# Patient Record
Sex: Male | Born: 1980
Health system: Southern US, Community
[De-identification: ages and names within clinical notes are randomized; demographics above are authoritative.]

## PROBLEM LIST (undated history)

## (undated) DIAGNOSIS — R278 Other lack of coordination: Secondary | ICD-10-CM

## (undated) DIAGNOSIS — R4789 Other speech disturbances: Secondary | ICD-10-CM

## (undated) DIAGNOSIS — G35 Multiple sclerosis: Secondary | ICD-10-CM

## (undated) DIAGNOSIS — I639 Cerebral infarction, unspecified: Secondary | ICD-10-CM

## (undated) DIAGNOSIS — M549 Dorsalgia, unspecified: Secondary | ICD-10-CM

## (undated) DIAGNOSIS — H538 Other visual disturbances: Secondary | ICD-10-CM

## (undated) DIAGNOSIS — R2689 Other abnormalities of gait and mobility: Secondary | ICD-10-CM

## (undated) DIAGNOSIS — R296 Repeated falls: Secondary | ICD-10-CM

## (undated) HISTORY — PX: HERNIA REPAIR: SHX51

## (undated) HISTORY — PX: FACIAL COSMETIC SURGERY: SHX629

---

## 2000-03-17 ENCOUNTER — Emergency Department (HOSPITAL_COMMUNITY): Admission: EM | Admit: 2000-03-17 | Discharge: 2000-03-17 | Payer: Self-pay | Admitting: Emergency Medicine

## 2001-04-14 ENCOUNTER — Emergency Department (HOSPITAL_COMMUNITY): Admission: EM | Admit: 2001-04-14 | Discharge: 2001-04-15 | Payer: Self-pay | Admitting: Emergency Medicine

## 2003-05-16 ENCOUNTER — Emergency Department (HOSPITAL_COMMUNITY): Admission: EM | Admit: 2003-05-16 | Discharge: 2003-05-16 | Payer: Self-pay | Admitting: Emergency Medicine

## 2006-03-26 ENCOUNTER — Emergency Department: Payer: Self-pay | Admitting: Emergency Medicine

## 2006-04-25 ENCOUNTER — Emergency Department: Payer: Self-pay | Admitting: Emergency Medicine

## 2006-07-17 ENCOUNTER — Emergency Department (HOSPITAL_COMMUNITY): Admission: EM | Admit: 2006-07-17 | Discharge: 2006-07-17 | Payer: Self-pay | Admitting: Emergency Medicine

## 2006-07-21 ENCOUNTER — Emergency Department (HOSPITAL_COMMUNITY): Admission: EM | Admit: 2006-07-21 | Discharge: 2006-07-21 | Payer: Self-pay | Admitting: Emergency Medicine

## 2006-09-07 ENCOUNTER — Emergency Department (HOSPITAL_COMMUNITY): Admission: EM | Admit: 2006-09-07 | Discharge: 2006-09-07 | Payer: Self-pay | Admitting: Emergency Medicine

## 2008-11-26 ENCOUNTER — Inpatient Hospital Stay (HOSPITAL_COMMUNITY): Admission: EM | Admit: 2008-11-26 | Discharge: 2008-12-02 | Payer: Self-pay | Admitting: *Deleted

## 2008-11-26 ENCOUNTER — Ambulatory Visit: Payer: Self-pay | Admitting: *Deleted

## 2008-11-26 ENCOUNTER — Emergency Department (HOSPITAL_COMMUNITY): Admission: EM | Admit: 2008-11-26 | Discharge: 2008-11-26 | Payer: Self-pay | Admitting: Emergency Medicine

## 2009-05-24 ENCOUNTER — Emergency Department (HOSPITAL_COMMUNITY): Admission: EM | Admit: 2009-05-24 | Discharge: 2009-05-24 | Payer: Self-pay | Admitting: Emergency Medicine

## 2009-10-17 ENCOUNTER — Emergency Department (HOSPITAL_COMMUNITY): Admission: EM | Admit: 2009-10-17 | Discharge: 2009-10-17 | Payer: Self-pay | Admitting: Emergency Medicine

## 2009-11-21 ENCOUNTER — Emergency Department (HOSPITAL_COMMUNITY): Admission: EM | Admit: 2009-11-21 | Discharge: 2009-11-21 | Payer: Self-pay | Admitting: Emergency Medicine

## 2010-01-22 ENCOUNTER — Emergency Department (HOSPITAL_COMMUNITY): Admission: EM | Admit: 2010-01-22 | Discharge: 2010-01-23 | Payer: Self-pay | Admitting: Emergency Medicine

## 2010-07-23 ENCOUNTER — Emergency Department (HOSPITAL_COMMUNITY): Admission: EM | Admit: 2010-07-23 | Discharge: 2010-07-23 | Payer: Self-pay | Admitting: Family Medicine

## 2011-02-13 ENCOUNTER — Emergency Department (HOSPITAL_COMMUNITY)
Admission: EM | Admit: 2011-02-13 | Discharge: 2011-02-14 | Disposition: A | Discharge status: Home / Self Care | Payer: Self-pay | Attending: Emergency Medicine | Admitting: Emergency Medicine

## 2011-02-13 DIAGNOSIS — R599 Enlarged lymph nodes, unspecified: Secondary | ICD-10-CM | POA: Insufficient documentation

## 2011-02-13 DIAGNOSIS — R042 Hemoptysis: Secondary | ICD-10-CM | POA: Insufficient documentation

## 2011-02-13 DIAGNOSIS — J029 Acute pharyngitis, unspecified: Secondary | ICD-10-CM | POA: Insufficient documentation

## 2011-02-14 ENCOUNTER — Emergency Department (HOSPITAL_COMMUNITY): Payer: Self-pay

## 2011-03-01 LAB — DIFFERENTIAL
Lymphocytes Relative: 2 % — ABNORMAL LOW (ref 12–46)
Lymphs Abs: 0.3 10*3/uL — ABNORMAL LOW (ref 0.7–4.0)
Monocytes Absolute: 0.8 10*3/uL (ref 0.1–1.0)
Monocytes Relative: 6 % (ref 3–12)
Neutro Abs: 12.9 10*3/uL — ABNORMAL HIGH (ref 1.7–7.7)
Neutrophils Relative %: 92 % — ABNORMAL HIGH (ref 43–77)

## 2011-03-01 LAB — POCT I-STAT, CHEM 8
BUN: 10 mg/dL (ref 6–23)
Calcium, Ion: 1.05 mmol/L — ABNORMAL LOW (ref 1.12–1.32)
Chloride: 109 mEq/L (ref 96–112)
Glucose, Bld: 97 mg/dL (ref 70–99)
Potassium: 4 mEq/L (ref 3.5–5.1)
Sodium: 138 mEq/L (ref 135–145)

## 2011-03-01 LAB — HEPATIC FUNCTION PANEL
Alkaline Phosphatase: 75 U/L (ref 39–117)
Indirect Bilirubin: 1.1 mg/dL — ABNORMAL HIGH (ref 0.3–0.9)
Total Bilirubin: 1.4 mg/dL — ABNORMAL HIGH (ref 0.3–1.2)

## 2011-03-01 LAB — LIPASE, BLOOD: Lipase: 16 U/L (ref 11–59)

## 2011-03-01 LAB — POCT CARDIAC MARKERS
Myoglobin, poc: 94.3 ng/mL (ref 12–200)
Troponin i, poc: <0.05 ng/mL (ref 0.00–0.09)

## 2011-03-01 LAB — RAPID URINE DRUG SCREEN, HOSP PERFORMED: Opiates: NOT DETECTED
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
## 2011-09-03 LAB — POCT I-STAT, CHEM 8
Chloride: 104 mEq/L (ref 96–112)
Creatinine, Ser: 1.5 mg/dL (ref 0.4–1.5)
Glucose, Bld: 80 mg/dL (ref 70–99)
Hemoglobin: 17.7 g/dL — ABNORMAL HIGH (ref 13.0–17.0)

## 2011-09-03 LAB — URINALYSIS, ROUTINE W REFLEX MICROSCOPIC
Bilirubin Urine: NEGATIVE
Glucose, UA: NEGATIVE mg/dL
Hgb urine dipstick: NEGATIVE
Ketones, ur: NEGATIVE mg/dL
Protein, ur: NEGATIVE mg/dL
Specific Gravity, Urine: 1.005 (ref 1.005–1.030)

## 2011-09-03 LAB — DIFFERENTIAL
Eosinophils Relative: 1 % (ref 0–5)
Lymphocytes Relative: 29 % (ref 12–46)
Lymphs Abs: 3 10*3/uL (ref 0.7–4.0)
Neutro Abs: 6.6 10*3/uL (ref 1.7–7.7)
Neutrophils Relative %: 64 % (ref 43–77)

## 2011-09-03 LAB — RAPID URINE DRUG SCREEN, HOSP PERFORMED
Amphetamines: NOT DETECTED
Benzodiazepines: NOT DETECTED

## 2011-09-03 LAB — CBC
HCT: 48.4 % (ref 39.0–52.0)
Platelets: 224 10*3/uL (ref 150–400)
RBC: 5.05 MIL/uL (ref 4.22–5.81)
WBC: 10.3 10*3/uL (ref 4.0–10.5)

## 2011-09-03 LAB — ETHANOL: Alcohol, Ethyl (B): 64 mg/dL — ABNORMAL HIGH (ref 0–10)

## 2011-09-22 ENCOUNTER — Emergency Department (HOSPITAL_COMMUNITY)
Admission: EM | Admit: 2011-09-22 | Discharge: 2011-09-22 | Disposition: A | Discharge status: Home / Self Care | Payer: Self-pay | Attending: Emergency Medicine | Admitting: Emergency Medicine

## 2011-09-22 ENCOUNTER — Emergency Department (HOSPITAL_COMMUNITY): Payer: Self-pay

## 2011-09-22 DIAGNOSIS — R6884 Jaw pain: Secondary | ICD-10-CM | POA: Insufficient documentation

## 2011-09-22 DIAGNOSIS — S0003XA Contusion of scalp, initial encounter: Secondary | ICD-10-CM | POA: Insufficient documentation

## 2011-09-22 DIAGNOSIS — IMO0002 Reserved for concepts with insufficient information to code with codable children: Secondary | ICD-10-CM | POA: Insufficient documentation

## 2011-10-22 ENCOUNTER — Emergency Department (HOSPITAL_COMMUNITY)
Admission: EM | Admit: 2011-10-22 | Discharge: 2011-10-23 | Discharge status: Against Medical Advice | Payer: Self-pay | Attending: Emergency Medicine | Admitting: Emergency Medicine

## 2011-10-22 ENCOUNTER — Encounter: Payer: Self-pay | Admitting: *Deleted

## 2011-10-22 DIAGNOSIS — Z5320 Procedure and treatment not carried out because of patient's decision for unspecified reasons: Secondary | ICD-10-CM | POA: Insufficient documentation

## 2011-10-22 DIAGNOSIS — R21 Rash and other nonspecific skin eruption: Secondary | ICD-10-CM | POA: Insufficient documentation

## 2011-10-22 DIAGNOSIS — R51 Headache: Secondary | ICD-10-CM | POA: Insufficient documentation

## 2011-10-22 NOTE — ED Notes (Signed)
Patient states he noted onset of headache, rash, pressure behind his eyes.  He also reports he feels flushed.  Patient was seen by clinic recently and his tests were "ok"

## 2011-10-25 ENCOUNTER — Emergency Department (HOSPITAL_COMMUNITY)
Admission: EM | Admit: 2011-10-25 | Discharge: 2011-10-25 | Discharge status: Against Medical Advice | Payer: Self-pay | Attending: Emergency Medicine | Admitting: Emergency Medicine

## 2011-10-25 ENCOUNTER — Encounter (HOSPITAL_COMMUNITY): Payer: Self-pay | Admitting: Emergency Medicine

## 2011-10-25 DIAGNOSIS — R21 Rash and other nonspecific skin eruption: Secondary | ICD-10-CM

## 2011-10-25 NOTE — ED Provider Notes (Signed)
Patient left prior to being evaluated by physician or midlevel  Martin Bailiff, MD 10/25/11 [phone]

## 2011-10-25 NOTE — ED Notes (Signed)
Facial rash x 1 week

## 2011-10-28 ENCOUNTER — Emergency Department (HOSPITAL_COMMUNITY)
Admission: EM | Admit: 2011-10-28 | Discharge: 2011-10-28 | Disposition: A | Discharge status: Home / Self Care | Payer: Self-pay | Attending: Emergency Medicine | Admitting: Emergency Medicine

## 2011-10-28 ENCOUNTER — Encounter (HOSPITAL_COMMUNITY): Payer: Self-pay | Admitting: Adult Health

## 2011-10-28 DIAGNOSIS — L0201 Cutaneous abscess of face: Secondary | ICD-10-CM | POA: Insufficient documentation

## 2011-10-28 DIAGNOSIS — L03211 Cellulitis of face: Secondary | ICD-10-CM | POA: Insufficient documentation

## 2011-10-28 DIAGNOSIS — R21 Rash and other nonspecific skin eruption: Secondary | ICD-10-CM | POA: Insufficient documentation

## 2011-10-28 MED ORDER — DOXYCYCLINE HYCLATE 100 MG PO CAPS: 100.0000 mg | ORAL_CAPSULE | Freq: Two times a day (BID) | ORAL | Status: AC

## 2011-10-28 MED ORDER — HYDROCODONE-ACETAMINOPHEN 5-325 MG PO TABS: 1.0000 | ORAL_TABLET | ORAL | Status: AC | PRN

## 2011-10-28 MED ORDER — LIDOCAINE-EPINEPHRINE 1 %-1:100000 IJ SOLN
10.0000 mL | Freq: Once | INTRAMUSCULAR | Status: DC
  Filled 2011-10-28: qty 10

## 2011-10-28 MED ORDER — LIDOCAINE HCL 2 % IJ SOLN
10.0000 mL | Freq: Once | INTRAMUSCULAR | Status: AC
  Administered 2011-10-28: 200 mg
  Filled 2011-10-28: qty 1

## 2011-10-28 NOTE — ED Provider Notes (Signed)
History     CSN: [id number] Arrival date & time: 10/28/2011  3:13 PM   First MD Initiated Contact with Patient 10/28/11 1553      Chief Complaint  Patient presents with  . Abscess    (Consider location/radiation/quality/duration/timing/severity/associated sxs/prior treatment) Patient is a 30 y.o. male presenting with abscess. The history is provided by the patient.  Abscess  This is a new problem. The current episode started more than one week ago. The onset was gradual. The problem occurs rarely. The problem has been gradually worsening. Affected Location: left chin. The problem is moderate. The abscess is characterized by redness, draining, painfulness and swelling. It is unknown what he was exposed to. Pertinent negatives include no fever. His past medical history does not include skin abscesses in family. There were no sick contacts.  Pt reports he recently had a rash to the bottom half of his face and was seen for this in the ED x 2, most recently 11/26. Rash has started to improve but as it improved, he developed an area of swelling with purulent drainage to left side of chin. No hx of prior abscess. No aggravating or alleviating factors. Has not tried anything to relieve the symptoms.  Past Medical History  Diagnosis Date  . Asthma     Past Surgical History  Procedure Date  . Hernia repair     History reviewed. No pertinent family history.  History  Substance Use Topics  . Smoking status: Current Everyday Smoker  . Smokeless tobacco: Not on file  . Alcohol Use: No      Review of Systems  Constitutional: Negative for fever and chills.  HENT: Positive for facial swelling. Negative for ear pain, neck pain, dental problem and tinnitus.   Eyes: Negative for pain.  Skin: Positive for rash and wound.  Neurological: Negative for headaches.  Psychiatric/Behavioral: Negative for confusion.  All other systems reviewed and are negative.    Allergies  Review of  patient's allergies indicates no known allergies.  Home Medications  No current outpatient prescriptions on file.  BP 134/77  Pulse 97  Temp(Src) 98.8 F (37.1 C) (Oral)  Resp 16  SpO2 100%  Physical Exam  Nursing note and vitals reviewed. Constitutional: He is oriented to person, place, and time. He appears well-developed and well-nourished. No distress.  HENT:  Head: Normocephalic and atraumatic.  Right Ear: External ear normal.  Left Ear: External ear normal.  Mouth/Throat: Oropharynx is clear and moist.       See skin exam  Eyes: Conjunctivae and EOM are normal. Pupils are equal, round, and reactive to light.  Neck: Normal range of motion. Neck supple.  Cardiovascular: Normal rate, regular rhythm and normal heart sounds.   Pulmonary/Chest: Effort normal and breath sounds normal. No respiratory distress.  Abdominal: Soft. He exhibits no distension. There is no tenderness.  Musculoskeletal: Normal range of motion. He exhibits no edema and no tenderness.  Lymphadenopathy:    He has no cervical adenopathy.  Neurological: He is alert and oriented to person, place, and time. No cranial nerve deficit. Coordination normal.  Skin: Skin is warm and dry.       Resolving papular rash to chin and upper lip with dry areas of skin. Abscess to left side of chin, 3cm area of induration with 3mm area of fluctuance to center- appears erythematous and edematous. Small location of drainage seen. Significant TTP  Psychiatric: He has a normal mood and affect. His behavior is normal.  ED Course  Procedures (including critical care time) INCISION AND DRAINAGE Performed by: Lorenz Coaster Consent: Verbal consent obtained. Risks and benefits: risks, benefits and alternatives were discussed Type: abscess  Body area: Left chin  Anesthesia: local infiltration  Local anesthetic: lidocaine 1% without epinephrine  Anesthetic total: 1 ml  Complexity: complex Blunt dissection to break up  loculations  Drainage: purulent  Drainage amount: Small  Packing material: no packing used given small size of abscess pocket  Patient tolerance: Patient tolerated the procedure well with no immediate complications.    Labs Reviewed - No data to display No results found.   No diagnosis found.    MDM  Afebrile, non-toxic appearing patient. Abscess drained, abx given.  Advised 48h recheck.        [address] [place], [place] 10/30/11 [phone]

## 2011-10-28 NOTE — ED Notes (Signed)
Reports 10 days of left sided chin swelling, drainage and pain. Purulent drainage noted.

## 2011-11-15 NOTE — ED Provider Notes (Signed)
Medical screening examination/treatment/procedure(s) were performed by non-physician practitioner and as supervising physician I was immediately available for consultation/collaboration.   Rolan Bucco, MD 11/15/11 [phone]

## 2013-07-21 ENCOUNTER — Emergency Department (HOSPITAL_COMMUNITY)
Admission: EM | Admit: 2013-07-21 | Discharge: 2013-07-21 | Disposition: A | Discharge status: Home / Self Care | Payer: Self-pay | Attending: Emergency Medicine | Admitting: Emergency Medicine

## 2013-07-21 ENCOUNTER — Encounter (HOSPITAL_COMMUNITY): Payer: Self-pay | Admitting: Emergency Medicine

## 2013-07-21 DIAGNOSIS — F17200 Nicotine dependence, unspecified, uncomplicated: Secondary | ICD-10-CM | POA: Insufficient documentation

## 2013-07-21 DIAGNOSIS — Y9289 Other specified places as the place of occurrence of the external cause: Secondary | ICD-10-CM | POA: Insufficient documentation

## 2013-07-21 DIAGNOSIS — M25561 Pain in right knee: Secondary | ICD-10-CM

## 2013-07-21 DIAGNOSIS — X500XXA Overexertion from strenuous movement or load, initial encounter: Secondary | ICD-10-CM | POA: Insufficient documentation

## 2013-07-21 DIAGNOSIS — Y939 Activity, unspecified: Secondary | ICD-10-CM | POA: Insufficient documentation

## 2013-07-21 DIAGNOSIS — Y990 Civilian activity done for income or pay: Secondary | ICD-10-CM | POA: Insufficient documentation

## 2013-07-21 DIAGNOSIS — J45909 Unspecified asthma, uncomplicated: Secondary | ICD-10-CM | POA: Insufficient documentation

## 2013-07-21 DIAGNOSIS — S8990XA Unspecified injury of unspecified lower leg, initial encounter: Secondary | ICD-10-CM | POA: Insufficient documentation

## 2013-07-21 MED ORDER — TRAMADOL HCL 50 MG PO TABS: 50.0000 mg | ORAL_TABLET | Freq: Four times a day (QID) | ORAL | Status: DC | PRN

## 2013-07-21 MED ORDER — NAPROXEN 500 MG PO TABS: 500.0000 mg | ORAL_TABLET | Freq: Two times a day (BID) | ORAL | Status: DC

## 2013-07-21 NOTE — ED Provider Notes (Signed)
Medical screening examination/treatment/procedure(s) were performed by non-physician practitioner and as supervising physician I was immediately available for consultation/collaboration.   Junius Argyle, MD 07/21/13 [phone]

## 2013-07-21 NOTE — ED Provider Notes (Signed)
CSN: [id number]     Arrival date & time 07/21/13  1526 History  This chart was scribed for non-physician practitioner, Magnus Sinning, PA-C working with Joya Gaskins, MD by Max Fickle en, ED scribe. This patient was seen in room WTR8/WTR8 and the patient's care was started at 3:36 PM.  HPI Comments: Chief Complaint  Patient presents with  . Knee Pain    12 hr hx of r/knee pain   The history is provided by the patient. No language interpreter was used.   HPI Comments: Martin Barrett is a 32 y.o. male who presents to the Emergency Department complaining of sudden onset, constant throbbing knee pain with associated swelling that started this morning around 3 AM after twisting his knee.  He states he tripped over a curb at work. He heard a pop and felt a "pop" when he fell. Pt states bending and ambulation worsens the pain. Pt used ice after the fall occurred and it decreased the swelling. He has not taken anything for the pain.  He denies injuring this knee in the past.   Past Medical History  Diagnosis Date  . Asthma    Past Surgical History  Procedure Laterality Date  . Hernia repair     No family history on file. History  Substance Use Topics  . Smoking status: Current Every Day Smoker  . Smokeless tobacco: Not on file  . Alcohol Use: No    Review of Systems  Musculoskeletal: Positive for joint swelling and arthralgias.  All other systems reviewed and are negative.    Allergies  Review of patient's allergies indicates no known allergies.  Home Medications  No current outpatient prescriptions on file.  BP 129/75  Pulse 84  Temp(Src) 98.8 F (37.1 C) (Oral)  SpO2 100%  Physical Exam  Nursing note and vitals reviewed. Constitutional: He is oriented to person, place, and time. He appears well-developed and well-nourished. No distress.  HENT:  Head: Normocephalic and atraumatic.  Eyes: EOM are normal.  Neck: Neck supple. No tracheal deviation present.   Cardiovascular: Normal rate, regular rhythm and normal heart sounds.    2+ dorsal pedis pulse bilaterally  Pulmonary/Chest: Effort normal and breath sounds normal. No respiratory distress.  Musculoskeletal: Normal range of motion. He exhibits no edema.  Tender to palpation along medial joint line. No obvious edema or erythema. Full ROM of right knee. Negative anterior and posterior drawer sign of right knee.  Pain with valgus and varus stress.  Neurological: He is alert and oriented to person, place, and time.  Distal sensation of right foot intact.   Skin: Skin is warm and dry.  Psychiatric: He has a normal mood and affect. His behavior is normal.    ED Course  DIAGNOSTIC STUDIES: Oxygen Saturation is 100% on RA, normal by my interpretation.    COORDINATION OF CARE: 3:55 PM-Discussed treatment plan which includes a knee brace and crutches with pt at bedside and pt agreed to plan. Advised pt to follow up with PCP and orthopaedist.   Procedures (including critical care time)  Labs Reviewed - No data to display No results found. No diagnosis found.  MDM  Patient presenting with knee pain after twisting his knee when stepping off of a curb at work.  No obvious deformity.  No obvious laxity of the knee.  Patient given knee immobilizer and crutches.  Patient also given referral to Orthopedics.  I personally performed the services described in this documentation, which was scribed in  my presence. The recorded information has been reviewed and is accurate.    Pascal Lux Lowell, PA-C 07/21/13 [phone]

## 2014-04-28 ENCOUNTER — Emergency Department (HOSPITAL_COMMUNITY): Payer: Self-pay

## 2014-04-28 ENCOUNTER — Encounter (HOSPITAL_COMMUNITY): Payer: Self-pay | Admitting: Emergency Medicine

## 2014-04-28 ENCOUNTER — Emergency Department (HOSPITAL_COMMUNITY)
Admission: EM | Admit: 2014-04-28 | Discharge: 2014-04-28 | Disposition: A | Discharge status: Home / Self Care | Payer: Self-pay | Attending: Emergency Medicine | Admitting: Emergency Medicine

## 2014-04-28 DIAGNOSIS — Y929 Unspecified place or not applicable: Secondary | ICD-10-CM | POA: Insufficient documentation

## 2014-04-28 DIAGNOSIS — Z79899 Other long term (current) drug therapy: Secondary | ICD-10-CM | POA: Insufficient documentation

## 2014-04-28 DIAGNOSIS — F410 Panic disorder [episodic paroxysmal anxiety] without agoraphobia: Secondary | ICD-10-CM | POA: Insufficient documentation

## 2014-04-28 DIAGNOSIS — S20219A Contusion of unspecified front wall of thorax, initial encounter: Secondary | ICD-10-CM | POA: Insufficient documentation

## 2014-04-28 DIAGNOSIS — IMO0002 Reserved for concepts with insufficient information to code with codable children: Secondary | ICD-10-CM | POA: Insufficient documentation

## 2014-04-28 DIAGNOSIS — F17200 Nicotine dependence, unspecified, uncomplicated: Secondary | ICD-10-CM | POA: Insufficient documentation

## 2014-04-28 DIAGNOSIS — J45901 Unspecified asthma with (acute) exacerbation: Secondary | ICD-10-CM | POA: Insufficient documentation

## 2014-04-28 DIAGNOSIS — Y9389 Activity, other specified: Secondary | ICD-10-CM | POA: Insufficient documentation

## 2014-04-28 DIAGNOSIS — F411 Generalized anxiety disorder: Secondary | ICD-10-CM | POA: Insufficient documentation

## 2014-04-28 MED ORDER — LORAZEPAM 2 MG/ML IJ SOLN
0.5000 mg | Freq: Once | INTRAMUSCULAR | Status: AC
  Administered 2014-04-28: 0.5 mg via INTRAVENOUS

## 2014-04-28 MED ORDER — LORAZEPAM 2 MG/ML IJ SOLN
1.0000 mg | Freq: Once | INTRAMUSCULAR | Status: DC
  Filled 2014-04-28: qty 1

## 2014-04-28 MED ORDER — LORAZEPAM 1 MG PO TABS: 1.0000 mg | ORAL_TABLET | Freq: Once | ORAL | Status: DC

## 2014-04-28 MED ORDER — IBUPROFEN 800 MG PO TABS
800.0000 mg | ORAL_TABLET | Freq: Once | ORAL | Status: AC
  Administered 2014-04-28: 800 mg via ORAL
  Filled 2014-04-28: qty 1
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
## 2014-04-28 NOTE — Discharge Instructions (Signed)
Chest Contusion A chest contusion is a deep bruise on your chest area. Contusions are the result of an injury that caused bleeding under the skin. A chest contusion may involve bruising of the skin, muscles, or ribs. The contusion may turn blue, purple, or yellow. Minor injuries will give you a painless contusion, but more severe contusions may stay painful and swollen for a few weeks. CAUSES  A contusion is usually caused by a blow, trauma, or direct force to an area of the body. SYMPTOMS   Swelling and redness of the injured area.  Discoloration of the injured area.  Tenderness and soreness of the injured area.  Pain. DIAGNOSIS  The diagnosis can be made by taking a history and performing a physical exam. An X-ray, CT scan, or MRI may be needed to determine if there were any associated injuries, such as broken bones (fractures) or internal injuries. TREATMENT  Often, the best treatment for a chest contusion is resting, icing, and applying cold compresses to the injured area. Deep breathing exercises may be recommended to reduce the risk of pneumonia. Over-the-counter medicines may also be recommended for pain control. HOME CARE INSTRUCTIONS   Put ice on the injured area.  Put ice in a plastic bag.  Place a towel between your skin and the bag.  Leave the ice on for 15-20 minutes, 03-04 times a day.  Only take over-the-counter or prescription medicines as directed by your caregiver. Your caregiver may recommend avoiding anti-inflammatory medicines (aspirin, ibuprofen, and naproxen) for 48 hours because these medicines may increase bruising.  Rest the injured area.  Perform deep-breathing exercises as directed by your caregiver.  Stop smoking if you smoke.  Do not lift objects over 5 pounds (2.3 kg) for 3 days or longer if recommended by your caregiver. SEEK IMMEDIATE MEDICAL CARE IF:   You have increased bruising or swelling.  You have pain that is getting worse.  You have  difficulty breathing.  You have dizziness, weakness, or fainting.  You have blood in your urine or stool.  You cough up or vomit blood.  Your swelling or pain is not relieved with medicines. MAKE SURE YOU:   Understand these instructions.  Will watch your condition.  Will get help right away if you are not doing well or get worse. Document Released: 08/10/2001 Document Revised: 08/09/2012 Document Reviewed: 05/08/2012 [facility] Patient Information 2014 [place], [place].  Blunt Trauma You have been evaluated for injuries. You have been examined and your caregiver has not found injuries serious enough to require hospitalization. It is common to have multiple bruises and sore muscles following an accident. These tend to feel worse for the first 24 hours. You will feel more stiffness and soreness over the next several hours and worse when you wake up the first morning after your accident. After this point, you should begin to improve with each passing day. The amount of improvement depends on the amount of damage done in the accident. Following your accident, if some part of your body does not work as it should, or if the pain in any area continues to increase, you should return to the Emergency Department for re-evaluation.  HOME CARE INSTRUCTIONS  Routine care for sore areas should include:  Ice to sore areas every 2 hours for 20 minutes while awake for the next 2 days.  Drink extra fluids (not alcohol).  Take a hot or warm shower or bath once or twice a day to increase blood flow to sore muscles.  This will help you "limber up".  Activity as tolerated. Lifting may aggravate neck or back pain.  Only take over-the-counter or prescription medicines for pain, discomfort, or fever as directed by your caregiver. Do not use aspirin. This may increase bruising or increase bleeding if there are small areas where this is happening. SEEK IMMEDIATE MEDICAL CARE IF:  Numbness, tingling, weakness,  or problem with the use of your arms or legs.  A severe headache is not relieved with medications.  There is a change in bowel or bladder control.  Increasing pain in any areas of the body.  Short of breath or dizzy.  Nauseated, vomiting, or sweating.  Increasing belly (abdominal) discomfort.  Blood in urine, stool, or vomiting blood. Panic Attacks Panic attacks are sudden, short-livedsurges of severe anxiety, fear, or discomfort. They may occur for no reason when you are relaxed, when you are anxious, or when you are sleeping. Panic attacks may occur for a number of reasons:  Healthy people occasionally have panic attacks in extreme, life-threatening situations, such as war or natural disasters. Normal anxiety is a protective mechanism of the body that helps [place] react to danger (fight or flight response). Panic attacks are often seen with anxiety disorders, such as panic disorder, social anxiety disorder, generalized anxiety disorder, and phobias. Anxiety disorders cause excessive or uncontrollable anxiety. They may interfere with your relationships or other life activities. Panic attacks are sometimes seen with other mental illnesses such as depression and posttraumatic stress disorder. Certain medical conditions, prescription medicines, and drugs of abuse can cause panic attacks. SYMPTOMS  Panic attacks start suddenly, peak within 20 minutes, and are accompanied by four or more of the following symptoms: Pounding heart or fast heart rate (palpitations). Sweating. Trembling or shaking. Shortness of breath or feeling smothered. Feeling choked. Chest pain or discomfort. Nausea or strange feeling in your stomach. Dizziness, lightheadedness, or feeling like you will faint. Chills or hot flushes. Numbness or tingling in your lips or hands and feet. Feeling that things are not real or feeling that you are not yourself. Fear of losing control or going crazy. Fear of dying. Some of  these symptoms can mimic serious medical conditions. For example, you may think you are having a heart attack. Although panic attacks can be very scary, they are not life threatening. DIAGNOSIS  Panic attacks are diagnosed through an assessment by your health care provider. Your health care provider will ask questions about your symptoms, such as where and when they occurred. Your health care provider will also ask about your medical history and use of alcohol and drugs, including prescription medicines. Your health care provider may order blood tests or other studies to rule out a serious medical condition. Your health care provider may refer you to a mental health professional for further evaluation. TREATMENT  Most healthy people who have one or two panic attacks in an extreme, life-threatening situation will not require treatment. The treatment for panic attacks associated with anxiety disorders or other mental illness typically involves counseling with a mental health professional, medicine, or a combination of both. Your health care provider will help determine what treatment is best for you. Panic attacks due to physical illness usually goes away with treatment of the illness. If prescription medicine is causing panic attacks, talk with your health care provider about stopping the medicine, decreasing the dose, or substituting another medicine. Panic attacks due to alcohol or drug abuse goes away with abstinence. Some adults need professional help in order  to stop drinking or using drugs. HOME CARE INSTRUCTIONS  Take all your medicines as prescribed.  Check with your health care provider before starting new prescription or over-the-counter medicines. Keep all follow up appointments with your health care provider. SEEK MEDICAL CARE IF: You are not able to take your medicines as prescribed. Your symptoms do not improve or get worse. SEEK IMMEDIATE MEDICAL CARE IF:  You experience panic attack  symptoms that are different than your usual symptoms. You have serious thoughts about hurting yourself or others. You are taking medicine for panic attacks and have a serious side effect. MAKE SURE YOU: Understand these instructions. Will watch your condition. Will get help right away if you are not doing well or get worse. Document Released: 11/15/2005 Document Revised: 09/05/2013 Document Reviewed: 06/29/2013 [facility] Patient Information 2014 [place], [place].   Pain in either shoulder in an area where a shoulder strap would be.  Feelings of lightheadedness or if you have a fainting episode. Sometimes it is not possible to identify all injuries immediately after the trauma. It is important that you continue to monitor your condition after the emergency department visit. If you feel you are not improving, or improving more slowly than should be expected, call your physician. If you feel your symptoms (problems) are worsening, return to the Emergency Department immediately. Document Released: 08/11/2001 Document Revised: 02/07/2012 Document Reviewed: 07/03/2008 [facility] Patient Information 2014 [place] SpringsExitCare, [place]LL[place].

## 2015-02-14 ENCOUNTER — Emergency Department (HOSPITAL_COMMUNITY): Payer: Self-pay

## 2015-02-14 ENCOUNTER — Encounter (HOSPITAL_COMMUNITY): Payer: Self-pay

## 2015-02-14 ENCOUNTER — Emergency Department (HOSPITAL_COMMUNITY)
Admission: EM | Admit: 2015-02-14 | Discharge: 2015-02-14 | Disposition: A | Discharge status: Home / Self Care | Payer: Self-pay | Attending: Emergency Medicine | Admitting: Emergency Medicine

## 2015-02-14 DIAGNOSIS — Y9389 Activity, other specified: Secondary | ICD-10-CM | POA: Insufficient documentation

## 2015-02-14 DIAGNOSIS — Y9289 Other specified places as the place of occurrence of the external cause: Secondary | ICD-10-CM | POA: Insufficient documentation

## 2015-02-14 DIAGNOSIS — Z79899 Other long term (current) drug therapy: Secondary | ICD-10-CM | POA: Insufficient documentation

## 2015-02-14 DIAGNOSIS — Z720 Tobacco use: Secondary | ICD-10-CM | POA: Insufficient documentation

## 2015-02-14 DIAGNOSIS — W010XXA Fall on same level from slipping, tripping and stumbling without subsequent striking against object, initial encounter: Secondary | ICD-10-CM | POA: Insufficient documentation

## 2015-02-14 DIAGNOSIS — Y990 Civilian activity done for income or pay: Secondary | ICD-10-CM | POA: Insufficient documentation

## 2015-02-14 DIAGNOSIS — J45909 Unspecified asthma, uncomplicated: Secondary | ICD-10-CM | POA: Insufficient documentation

## 2015-02-14 DIAGNOSIS — S8391XA Sprain of unspecified site of right knee, initial encounter: Secondary | ICD-10-CM | POA: Insufficient documentation

## 2015-02-14 MED ORDER — HYDROCODONE-ACETAMINOPHEN 5-325 MG PO TABS: 1.0000 | ORAL_TABLET | Freq: Four times a day (QID) | ORAL | Status: DC | PRN

## 2015-02-14 NOTE — Discharge Instructions (Signed)
Knee Bracing  Knee braces are supports to help stabilize and protect an injured or painful knee. They come in many different styles. They should support and protect the knee without increasing the chance of other injuries to yourself or others. It is important not to have a false sense of security when using a brace. Knee braces that help you to keep using your knee:  · Do not restore normal knee stability under high stress forces.  · May decrease some aspects of athletic performance.  Some of the different types of knee braces are:  · Prophylactic knee braces are designed to prevent or reduce the severity of knee injuries during sports that make injury to the knee more likely.  · Rehabilitative knee braces are designed to allow protected motion of:  ¨ Injured knees.  ¨ Knees that have been treated with or without surgery.  There is no evidence that the use of a supportive knee brace protects the graft following a successful anterior cruciate ligament (ACL) reconstruction. However, braces are sometimes used to:   · Protect injured ligaments.  · Control knee movement during the initial healing period.  They may be used as part of the treatment program for the various injured ligaments or cartilage of the knee including the:  · Anterior cruciate ligament.  · Medial collateral ligament.  · Medial or lateral cartilage (meniscus).  · Posterior cruciate ligament.  · Lateral collateral ligament.  Rehabilitative knee braces are most commonly used:  · During crutch-assisted walking right after injury.  · During crutch-assisted walking right after surgery to repair the cartilage and/or cruciate ligament injury.  · For a short period of time, 2-8 weeks, after the injury or surgery.  The value of a rehabilitative brace as opposed to a cast or splint includes the:  · Ability to adjust the brace for swelling.  · Ability to remove the brace for examinations, icing, or showering.  · Ability to allow for movement in a controlled  range of motion.  Functional knee braces give support to knees that have already been injured. They are designed to provide stability for the injured knee and provide protection after repair. Functional knee braces may not affect performance much. Lower extremity muscle strengthening, flexibility, and improvement in technique are more important than bracing in treating ligamentous knee injuries. Functional braces are not a substitute for rehabilitation or surgical procedures.  Unloader/off-loader braces are designed to provide pain relief in arthritic knees. Patients with wear and tear arthritis from growing old or from an old cartilage injury (osteoarthritis) of the knee, and bowlegged (varus) or knock-knee (valgus) deformities, often develop increased pain in the arthritic side due to increased loading. Unloader/off-loader braces are made to reduce uneven loading in such knees. There is reduction in bowing out movement in bowlegged knees when the correct unloader brace is used. Patients with advanced osteoarthritis or severe varus or valgus alignment problems would not likely benefit from bracing.  Patellofemoral braces help the kneecap to move smoothly and well centered over the end of the femur in the knee.   Most people who wear knee braces feel that they help. However, there is a lack of scientific evidence that knee braces are helpful at the level needed for athletic participation to prevent injury. In spite of this, athletes report an increase in knee stability, pain relief, performance improvement, and confidence during athletics when using a brace.   Different knee problems require different knee braces:  · Your caregiver may suggest one   also need one for pain in the front of your knee that is not getting better with strengthening and  flexibility exercises. Get your caregiver's advice if you want to try a knee brace. The caregiver will advise you on where to get them and provide a prescription when it is needed to fashion and/or fit the brace. Knee braces are the least important part of preventing knee injuries or getting better following injury. Stretching, strengthening and technique improvement are far more important in caring for and preventing knee injuries. When strengthening your knee, increase your activities a little at a time so as not to develop injuries from overuse. Work out an exercise plan with your caregiver and/or physical therapist to get the best program for you. Do not let a knee brace become a crutch. Always remember, there are no braces which support the knee as well as your original ligaments and cartilage you were born with. Conditioning, proper warm-up, and stretching remain the most important parts of keeping your knees healthy. HOW TO USE A KNEE BRACE  During sports, knee braces should be used as directed by your caregiver.  Make sure that the hinges are where the knee bends.  Straps, tapes, or hook-and-loop tapes should be fastened around your leg as instructed.  You should check the placement of the brace during activities to make sure that it has not moved. Poorly positioned braces can hurt rather than help you.  To work well, a knee brace should be worn during all activities that put you at risk of knee injury.  Warm up properly before beginning athletic activities. HOME CARE INSTRUCTIONS  Knee braces often get damaged during normal use. Replace worn-out braces for maximum benefit.  Clean regularly with soap and water.  Inspect your brace often for wear and tear.  Cover exposed metal to protect others from injury.  Durable materials may cost more, but last longer. SEEK IMMEDIATE MEDICAL CARE IF:   Your knee seems to be getting worse rather than better.  You have increasing pain or  swelling in the knee.  You have problems caused by the knee brace.  You have increased swelling or inflammation (redness or soreness) in your knee.  Your knee becomes warm and more painful and you develop an unexplained temperature over 101F (38.3C). MAKE SURE YOU:   Understand these instructions.  Will watch your condition.  Will get help right away if you are not doing well or get worse. See your caregiver, physical therapist, or orthopedic surgeon for additional information. Document Released: 02/05/2004 Document Revised: 04/01/2014 Document Reviewed: 05/14/2009 [facility] Patient Information 2015 [place], [place]. This information is not intended to replace advice given to you by your health care provider. Make sure you discuss any questions you have with your health care provider.  Knee Sprain A knee sprain is a tear in one of the strong, fibrous tissues that connect the bones (ligaments) in your knee. The severity of the sprain depends on how much of the ligament is torn. The tear can be either partial or complete. CAUSES  Often, sprains are a result of a fall or injury. The force of the impact causes the fibers of your ligament to stretch too much. This excess tension causes the fibers of your ligament to tear. SIGNS AND SYMPTOMS  You may have some loss of motion in your knee. Other symptoms include:  Bruising.  Pain in the knee area.  Tenderness of the knee to the touch.  Swelling. DIAGNOSIS  To diagnose  a knee sprain, your health care provider will physically examine your knee. Your health care provider may also suggest an X-ray exam of your knee to make sure no bones are broken. °TREATMENT  °If your ligament is only partially torn, treatment usually involves keeping the knee in a fixed position (immobilization) or bracing your knee for activities that require movement for several weeks. To do this, your health care provider will apply a bandage, cast, or splint to keep your  knee from moving and to support your knee during movement until it heals. For a partially torn ligament, the healing process usually takes 4-6 weeks. °If your ligament is completely torn, depending on which ligament it is, you may need surgery to reconnect the ligament to the bone or reconstruct it. After surgery, a cast or splint may be applied and will need to stay on your knee for 4-6 weeks while your ligament heals. °HOME CARE INSTRUCTIONS °· Keep your injured knee elevated to decrease swelling. °· To ease pain and swelling, apply ice to the injured area: °¨ Put ice in a plastic bag. °¨ Place a towel between your skin and the bag. °¨ Leave the ice on for 20 minutes, 2-3 times a day. °· Only take medicine for pain as directed by your health care provider. °· Do not leave your knee unprotected until pain and stiffness go away (usually 4-6 weeks). °· If you have a cast or splint, do not allow it to get wet. If you have been instructed not to remove it, cover it with a plastic bag when you shower or bathe. Do not swim. °· Your health care provider may suggest exercises for you to do during your recovery to prevent or limit permanent weakness and stiffness. °SEEK IMMEDIATE MEDICAL CARE IF: °· Your cast or splint becomes damaged. °· Your pain becomes worse. °· You have significant pain, swelling, or numbness below the cast or splint. °MAKE SURE YOU: °· Understand these instructions. °· Will watch your condition. °· Will get help right away if you are not doing well or get worse. °Document Released: 11/15/2005 Document Revised: 09/05/2013 Document Reviewed: 06/27/2013 °ExitCare® Patient Information ©2015 [employer]. This information is not intended to replace advice given to you by your health care provider. Make sure you discuss any questions you have with your health care provider. ° °

## 2015-02-14 NOTE — ED Notes (Signed)
Pt at work today.  Stepped wrong on rt knee.  Difficulty walking

## 2015-02-14 NOTE — ED Provider Notes (Signed)
CSN: [id number]     Arrival date & time 02/14/15  4098 History   First MD Initiated Contact with Patient 02/14/15 [phone]     Chief Complaint  Patient presents with  . Knee Pain     Patient is a 34 y.o. male presenting with knee pain. The history is provided by the patient. No language interpreter was used.  Knee Pain  Mr. Bartl presents for evaluation of right knee pain. He has a history of chronic right knee pain and wears a brace at times for comfort. He was at work when he stepped off a curb and felt the knee buckled and he fell to the right. He has pain throughout the right knee and pain with range of motion. There is pain with ambulation. This happened prior to ED arrival today. He denies any fevers, additional injuries. Symptoms are moderate and constant.  Past Medical History  Diagnosis Date  . Asthma    Past Surgical History  Procedure Laterality Date  . Hernia repair    . Facial cosmetic surgery     Family History  Problem Relation Age of Onset  . Diabetes Mother   . Hypertension Mother    History  Substance Use Topics  . Smoking status: Current Every Day Smoker  . Smokeless tobacco: Not on file  . Alcohol Use: No    Review of Systems  All other systems reviewed and are negative.     Allergies  Review of patient's allergies indicates no known allergies.  Home Medications   Prior to Admission medications   Medication Sig Start Date End Date Taking? Authorizing Provider  albuterol (PROVENTIL HFA;VENTOLIN HFA) 108 (90 BASE) MCG/ACT inhaler Inhale 2 puffs into the lungs every 6 (six) hours as needed for wheezing.   Yes Historical Provider, MD   BP 125/74 mmHg  Pulse 77  Temp(Src) 97.9 F (36.6 C) (Oral)  Resp 16  SpO2 100% Physical Exam  Constitutional: He is oriented to person, place, and time. He appears well-developed and well-nourished.  HENT:  Head: Normocephalic and atraumatic.  Cardiovascular: Normal rate and regular rhythm.   Pulmonary/Chest:  Effort normal. No respiratory distress.  Musculoskeletal:  Diffuse tenderness to palpation to the right anterior, lateral, medial knee. There is no appreciable effusion. Patient is able to flex and extend the right knee but does have pain with range of motion. 2+ DP pulses bilaterally.  Neurological: He is alert and oriented to person, place, and time.  5 out of 5 strength in bilateral lower extremities.  Skin: Skin is warm and dry.  Psychiatric: He has a normal mood and affect. His behavior is normal.  Nursing note and vitals reviewed.   ED Course  Procedures (including critical care time) Labs Review Labs Reviewed - No data to display  Imaging Review Dg Knee Complete 4 Views Right  02/14/2015   CLINICAL DATA:  Right knee pain.  Difficulty walking.  EXAM: RIGHT KNEE - COMPLETE 4+ VIEW  COMPARISON:  None.  FINDINGS: Right knee is located. No acute bone or soft tissue abnormalities are present. There is no significant effusion.  IMPRESSION: Negative right knee radiographs.   Electronically Signed   By: Marin Roberts M.D.   On: 02/14/2015 09:09     EKG Interpretation None      MDM   Final diagnoses:  Knee sprain, right, initial encounter    Patient with history of ongoing knee pain here with increased pain following a [place] injury with twisting. There is no  evidence of acute fracture or dislocation on examination and no ligamentous laxity in the emergency department. Discussed with patient home care for knee strain with knee brace for comfort and crutches for weightbearing as tolerated and orthopedics follow-up. Recommend ibuprofen at home for pain control. Prescription for Norco written for breakthrough pain.    Tilden Fossa, MD 02/14/15 0930

## 2016-08-07 ENCOUNTER — Emergency Department (HOSPITAL_COMMUNITY)
Admission: EM | Admit: 2016-08-07 | Discharge: 2016-08-07 | Disposition: A | Discharge status: Home / Self Care | Payer: No Typology Code available for payment source | Attending: Emergency Medicine | Admitting: Emergency Medicine

## 2016-08-07 ENCOUNTER — Encounter (HOSPITAL_COMMUNITY): Payer: Self-pay | Admitting: *Deleted

## 2016-08-07 ENCOUNTER — Emergency Department (HOSPITAL_COMMUNITY): Payer: No Typology Code available for payment source

## 2016-08-07 DIAGNOSIS — Y92410 Unspecified street and highway as the place of occurrence of the external cause: Secondary | ICD-10-CM | POA: Diagnosis not present

## 2016-08-07 DIAGNOSIS — S060X0A Concussion without loss of consciousness, initial encounter: Secondary | ICD-10-CM | POA: Insufficient documentation

## 2016-08-07 DIAGNOSIS — S0990XA Unspecified injury of head, initial encounter: Secondary | ICD-10-CM | POA: Diagnosis present

## 2016-08-07 DIAGNOSIS — Y939 Activity, unspecified: Secondary | ICD-10-CM | POA: Insufficient documentation

## 2016-08-07 DIAGNOSIS — F17200 Nicotine dependence, unspecified, uncomplicated: Secondary | ICD-10-CM | POA: Diagnosis not present

## 2016-08-07 DIAGNOSIS — Y999 Unspecified external cause status: Secondary | ICD-10-CM | POA: Diagnosis not present

## 2016-08-07 DIAGNOSIS — J45909 Unspecified asthma, uncomplicated: Secondary | ICD-10-CM | POA: Diagnosis not present

## 2016-08-07 MED ORDER — ONDANSETRON HCL 4 MG PO TABS: 4.0000 mg | ORAL_TABLET | Freq: Three times a day (TID) | ORAL | 0 refills | Status: DC | PRN

## 2016-08-07 MED ORDER — HYDROCODONE-ACETAMINOPHEN 5-325 MG PO TABS
1.0000 | ORAL_TABLET | Freq: Once | ORAL | Status: AC
  Administered 2016-08-07: 1 via ORAL
  Filled 2016-08-07: qty 1

## 2016-08-07 MED ORDER — HYDROCODONE-ACETAMINOPHEN 5-325 MG PO TABS: ORAL_TABLET | ORAL | 0 refills | Status: DC
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
## 2016-08-07 NOTE — ED Notes (Signed)
Patient transported to CT SCAN . 
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
## 2016-08-07 NOTE — Discharge Instructions (Signed)
Do not participate in any sports or any activities that could result in head trauma until you are cleared by your pediatrician,  primary care physician or neurologist.   Take vicodin for breakthrough pain, do not drink alcohol, drive, care for children or do other critical tasks while taking vicodin.

## 2017-01-10 ENCOUNTER — Emergency Department (HOSPITAL_COMMUNITY)
Admission: EM | Admit: 2017-01-10 | Discharge: 2017-01-10 | Disposition: A | Discharge status: Home / Self Care | Payer: Self-pay | Attending: Emergency Medicine | Admitting: Emergency Medicine

## 2017-01-10 ENCOUNTER — Encounter (HOSPITAL_COMMUNITY): Payer: Self-pay | Admitting: Emergency Medicine

## 2017-01-10 DIAGNOSIS — R197 Diarrhea, unspecified: Secondary | ICD-10-CM | POA: Insufficient documentation

## 2017-01-10 DIAGNOSIS — R112 Nausea with vomiting, unspecified: Secondary | ICD-10-CM | POA: Insufficient documentation

## 2017-01-10 DIAGNOSIS — Z79899 Other long term (current) drug therapy: Secondary | ICD-10-CM | POA: Insufficient documentation

## 2017-01-10 DIAGNOSIS — F17200 Nicotine dependence, unspecified, uncomplicated: Secondary | ICD-10-CM | POA: Insufficient documentation

## 2017-01-10 DIAGNOSIS — J45909 Unspecified asthma, uncomplicated: Secondary | ICD-10-CM | POA: Insufficient documentation

## 2017-01-10 LAB — COMPREHENSIVE METABOLIC PANEL
ALK PHOS: 53 U/L (ref 38–126)
ALT: 11 U/L — ABNORMAL LOW (ref 17–63)
ANION GAP: 7 (ref 5–15)
AST: 16 U/L (ref 15–41)
Albumin: 3.3 g/dL — ABNORMAL LOW (ref 3.5–5.0)
BUN: 5 mg/dL — ABNORMAL LOW (ref 6–20)
CALCIUM: 9 mg/dL (ref 8.9–10.3)
CO2: 27 mmol/L (ref 22–32)
Chloride: 107 mmol/L (ref 101–111)
Creatinine, Ser: 1.37 mg/dL — ABNORMAL HIGH (ref 0.61–1.24)
GFR calc non Af Amer: >60 mL/min (ref >60)
Glucose, Bld: 114 mg/dL — ABNORMAL HIGH (ref 65–99)
Potassium: 3.4 mmol/L — ABNORMAL LOW (ref 3.5–5.1)
SODIUM: 141 mmol/L (ref 135–145)
TOTAL PROTEIN: 6.6 g/dL (ref 6.5–8.1)
Total Bilirubin: 0.6 mg/dL (ref 0.3–1.2)

## 2017-01-10 LAB — I-STAT CG4 LACTIC ACID, ED: LACTIC ACID, VENOUS: 1.68 mmol/L (ref 0.5–1.9)

## 2017-01-10 LAB — CBC
HCT: 47 % (ref 39.0–52.0)
HEMOGLOBIN: 16.2 g/dL (ref 13.0–17.0)
MCH: 31.7 pg (ref 26.0–34.0)
MCHC: 34.5 g/dL (ref 30.0–36.0)
MCV: 92 fL (ref 78.0–100.0)
Platelets: 223 10*3/uL (ref 150–400)
RBC: 5.11 MIL/uL (ref 4.22–5.81)
RDW: 12.6 % (ref 11.5–15.5)
WBC: 6.9 10*3/uL (ref 4.0–10.5)

## 2017-01-10 LAB — LIPASE, BLOOD: Lipase: 13 U/L (ref 11–51)

## 2017-01-10 MED ORDER — OSELTAMIVIR PHOSPHATE 75 MG PO CAPS: 75.0000 mg | ORAL_CAPSULE | Freq: Two times a day (BID) | ORAL | 0 refills | Status: DC

## 2017-01-10 MED ORDER — ONDANSETRON HCL 4 MG/2ML IJ SOLN
4.0000 mg | Freq: Once | INTRAMUSCULAR | Status: AC
  Administered 2017-01-10: 4 mg via INTRAVENOUS
  Filled 2017-01-10: qty 2

## 2017-01-10 MED ORDER — SODIUM CHLORIDE 0.9 % IV BOLUS (SEPSIS)
1000.0000 mL | Freq: Once | INTRAVENOUS | Status: AC
  Administered 2017-01-10: 1000 mL via INTRAVENOUS

## 2017-01-10 MED ORDER — ONDANSETRON 4 MG PO TBDP: ORAL_TABLET | ORAL | 0 refills | Status: DC

## 2017-01-10 MED ORDER — KETOROLAC TROMETHAMINE 30 MG/ML IJ SOLN
30.0000 mg | Freq: Once | INTRAMUSCULAR | Status: AC
  Administered 2017-01-10: 30 mg via INTRAVENOUS
  Filled 2017-01-10: qty 1

## 2017-01-10 NOTE — Discharge Instructions (Signed)
Drink plenty of fluids. Take Tylenol or Motrin for fevers and aches. He can take Imodium for diarrhea. Follow-up if not improving

## 2017-01-10 NOTE — ED Provider Notes (Signed)
[facility] Provider Note   CSN: [id number] Arrival date & time: 01/10/17  1138  By signing my name below, I, Teofilo Pod, attest that this documentation has been prepared under the direction and in the presence of Bethann Berkshire, MD . Electronically Signed: Teofilo Pod, ED Scribe. 01/10/2017. 2:06 PM.    History   Chief Complaint Chief Complaint  Patient presents with  . Diarrhea    The history is provided by the patient. No language interpreter was used.  Diarrhea   This is a new problem. The current episode started more than 2 days ago. The problem occurs 2 to 4 times per day. The problem has not changed since onset.The stool consistency is described as mucous. There has been no fever. Associated symptoms include vomiting, myalgias and cough. Pertinent negatives include no abdominal pain and no headaches. He has tried nothing for the symptoms. The treatment provided no relief.   HPI Comments:  Martin Barrett is a 36 y.o. male who presents to the Emergency Department complaining of multiple, daily episodes of diarrhea x 1 week. Pt states that 3 days ago he began seeing mucous in his diarrhea, and notes that he has had diarrhea 3 times today. Pt complains of associated vomiting (1x today), dizziness, cough, chest pain, generalized body aches. Pt got a flu shot this year. No alleviating factors noted. Pt denies other associated symptoms.     Past Medical History:  Diagnosis Date  . Asthma     There are no active problems to display for this patient.   Past Surgical History:  Procedure Laterality Date  . FACIAL COSMETIC SURGERY    . HERNIA REPAIR         Home Medications    Prior to Admission medications   Medication Sig Start Date End Date Taking? Authorizing Provider  albuterol (PROVENTIL HFA;VENTOLIN HFA) 108 (90 BASE) MCG/ACT inhaler Inhale 2 puffs into the lungs every 6 (six) hours as needed for wheezing.    Historical Provider, MD    HYDROcodone-acetaminophen (NORCO/VICODIN) 5-325 MG tablet Take 1-2 tablets by mouth every 6 hours as needed for pain and/or cough. 08/07/16   Nicole Pisciotta, PA-C  ondansetron (ZOFRAN) 4 MG tablet Take 1 tablet (4 mg total) by mouth every 8 (eight) hours as needed for nausea or vomiting. 08/07/16   Wynetta Emery, PA-C    Family History Family History  Problem Relation Age of Onset  . Diabetes Mother   . Hypertension Mother     Social History Social History  Substance Use Topics  . Smoking status: Current Every Day Smoker  . Smokeless tobacco: Not on file  . Alcohol use No     Allergies   Patient has no known allergies.   Review of Systems Review of Systems  Constitutional: Negative for appetite change and fatigue.  HENT: Negative for congestion, ear discharge and sinus pressure.   Eyes: Negative for discharge.  Respiratory: Positive for cough.   Cardiovascular: Positive for chest pain.  Gastrointestinal: Positive for diarrhea and vomiting. Negative for abdominal pain.  Genitourinary: Negative for frequency and hematuria.  Musculoskeletal: Positive for myalgias. Negative for back pain.  Skin: Negative for rash.  Neurological: Positive for dizziness. Negative for seizures and headaches.  Psychiatric/Behavioral: Negative for hallucinations.     Physical Exam Updated Vital Signs BP 127/96 (BP Location: Left Arm)   Pulse 81   Temp 98 F (36.7 C) (Oral)   Resp 18   Ht 5\' 10"  (1.778 m)  Wt 187 lb (84.8 kg)   SpO2 100%   BMI 26.83 kg/m   Physical Exam  Constitutional: He is oriented to person, place, and time. He appears well-developed.  HENT:  Head: Normocephalic.  Eyes: Conjunctivae and EOM are normal. No scleral icterus.  Neck: Neck supple. No thyromegaly present.  Cardiovascular: Normal rate and regular rhythm.  Exam reveals no gallop and no friction rub.   No murmur heard. Pulmonary/Chest: No stridor. He has no wheezes. He has no rales. He exhibits no  tenderness.  Abdominal: He exhibits no distension. There is no tenderness. There is no rebound.  Musculoskeletal: Normal range of motion. He exhibits no edema.  Lymphadenopathy:    He has no cervical adenopathy.  Neurological: He is oriented to person, place, and time. He exhibits normal muscle tone. Coordination normal.  Skin: No rash noted. No erythema.  Psychiatric: He has a normal mood and affect. His behavior is normal.     ED Treatments / Results  DIAGNOSTIC STUDIES:  Oxygen Saturation is 100% on RA, normal by my interpretation.    COORDINATION OF CARE:  2:04 PM Discussed treatment plan with pt at bedside and pt agreed to plan.   Labs (all labs ordered are listed, but only abnormal results are displayed) Labs Reviewed  COMPREHENSIVE METABOLIC PANEL - Abnormal; Notable for the following:       Result Value   Potassium 3.4 (*)    Glucose, Bld 114 (*)    BUN 5 (*)    Creatinine, Ser 1.37 (*)    Albumin 3.3 (*)    ALT 11 (*)    All other components within normal limits  LIPASE, BLOOD  CBC  URINALYSIS, ROUTINE W REFLEX MICROSCOPIC  I-STAT CG4 LACTIC ACID, ED    EKG  EKG Interpretation None       Radiology No results found.  Procedures Procedures (including critical care time)  Medications Ordered in ED Medications - No data to display   Initial Impression / Assessment and Plan / ED Course  I have reviewed the triage vital signs and the nursing notes.  Pertinent labs & imaging results that were available during my care of the patient were reviewed by me and considered in my medical decision making (see chart for details).     Patient with gastroenteritis possibly related to influenza. Patient will be put on Zofran Tamiflu and told to take Motrin or Tylenol. Follow-up as needed  Final Clinical Impressions(s) / ED Diagnoses   Final diagnoses:  None    New Prescriptions New Prescriptions   No medications on file  The chart was scribed for me under  my direct supervision.  I personally performed the history, physical, and medical decision making and all procedures in the evaluation of this patient.Bethann Berkshire, MD 01/10/17 1515

## 2017-01-10 NOTE — ED Triage Notes (Signed)
Pt here for diarrhea with mucous and nausea; pt sts productive also
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
## 2017-10-28 ENCOUNTER — Encounter (HOSPITAL_COMMUNITY): Payer: Self-pay | Admitting: Emergency Medicine

## 2017-10-28 ENCOUNTER — Other Ambulatory Visit: Payer: Self-pay

## 2017-10-28 ENCOUNTER — Emergency Department (HOSPITAL_COMMUNITY)
Admission: EM | Admit: 2017-10-28 | Discharge: 2017-10-28 | Disposition: A | Discharge status: Home / Self Care | Payer: Self-pay | Attending: Emergency Medicine | Admitting: Emergency Medicine

## 2017-10-28 DIAGNOSIS — F17200 Nicotine dependence, unspecified, uncomplicated: Secondary | ICD-10-CM | POA: Insufficient documentation

## 2017-10-28 DIAGNOSIS — R072 Precordial pain: Secondary | ICD-10-CM | POA: Insufficient documentation

## 2017-10-28 DIAGNOSIS — J45909 Unspecified asthma, uncomplicated: Secondary | ICD-10-CM | POA: Insufficient documentation

## 2017-10-28 DIAGNOSIS — R0602 Shortness of breath: Secondary | ICD-10-CM | POA: Insufficient documentation

## 2017-10-28 DIAGNOSIS — Z5320 Procedure and treatment not carried out because of patient's decision for unspecified reasons: Secondary | ICD-10-CM | POA: Insufficient documentation

## 2017-10-28 MED ORDER — SODIUM CHLORIDE 0.9 % IV BOLUS (SEPSIS)
1000.0000 mL | Freq: Once | INTRAVENOUS | Status: AC
  Administered 2017-10-28: 1000 mL via INTRAVENOUS

## 2017-10-28 MED ORDER — ASPIRIN 81 MG PO CHEW
324.0000 mg | CHEWABLE_TABLET | Freq: Once | ORAL | Status: AC
  Administered 2017-10-28: 324 mg via ORAL
  Filled 2017-10-28: qty 4

## 2017-10-28 NOTE — Discharge Instructions (Signed)
You have been seen in the Emergency Department (ED) today for chest pain.  As we have discussed there are potentially life-threatening problems that could be happening. You have decided to leave against medical advice. You are welcome to return at any time.   Please follow up with the recommended doctor as instructed above in these documents regarding today?s emergent visit and your recent symptoms to discuss further management.  Continue to take your regular medications. If you are not doing so already, please also take a daily baby aspirin (81 mg), at least until you follow up with your doctor.  Return to the Emergency Department (ED) if you experience any further chest pain/pressure/tightness, difficulty breathing, or sudden sweating, or other symptoms that concern you.   Chest Pain (Nonspecific) It is often hard to give a specific diagnosis for the cause of chest pain. There is always a chance that your pain could be related to something serious, such as a heart attack or a blood clot in the lungs. You need to follow up with your health care provider for further evaluation. CAUSES  Heartburn. Pneumonia or bronchitis. Anxiety or stress. Inflammation around your heart (pericarditis) or lung (pleuritis or pleurisy). A blood clot in the lung. A collapsed lung (pneumothorax). It can develop suddenly on its own (spontaneous pneumothorax) or from trauma to the chest. Shingles infection (herpes zoster virus). The chest wall is composed of bones, muscles, and cartilage. Any of these can be the source of the pain. The bones can be bruised by injury. The muscles or cartilage can be strained by coughing or overwork. The cartilage can be affected by inflammation and become sore (costochondritis). DIAGNOSIS  Lab tests or other studies may be needed to find the cause of your pain. Your health care provider may have you take a test called an ambulatory electrocardiogram (ECG). An ECG records your heartbeat  patterns over a 24-hour period. You may also have other tests, such as: Transthoracic echocardiogram (TTE). During echocardiography, sound waves are used to evaluate how blood flows through your heart. Transesophageal echocardiogram (TEE). Cardiac monitoring. This allows your health care provider to monitor your heart rate and rhythm in real time. Holter monitor. This is a portable device that records your heartbeat and can help diagnose heart arrhythmias. It allows your health care provider to track your heart activity for several days, if needed. Stress tests by exercise or by giving medicine that makes the heart beat faster. TREATMENT  Treatment depends on what may be causing your chest pain. Treatment may include: Acid blockers for heartburn. Anti-inflammatory medicine. Pain medicine for inflammatory conditions. Antibiotics if an infection is present. You may be advised to change lifestyle habits. This includes stopping smoking and avoiding alcohol, caffeine, and chocolate. You may be advised to keep your head raised (elevated) when sleeping. This reduces the chance of acid going backward from your stomach into your esophagus. Most of the time, nonspecific chest pain will improve within 2-3 days with rest and mild pain medicine.  HOME CARE INSTRUCTIONS  If antibiotics were prescribed, take them as directed. Finish them even if you start to feel better. For the next few days, avoid physical activities that bring on chest pain. Continue physical activities as directed. Do not use any tobacco products, including cigarettes, chewing tobacco, or electronic cigarettes. Avoid drinking alcohol. Only take medicine as directed by your health care provider. Follow your health care provider's suggestions for further testing if your chest pain does not go away. Keep any follow-up  appointments you made. If you do not go to an appointment, you could develop lasting (chronic) problems with pain. If there  is any problem keeping an appointment, call to reschedule. SEEK MEDICAL CARE IF:  Your chest pain does not go away, even after treatment. You have a rash with blisters on your chest. You have a fever. SEEK IMMEDIATE MEDICAL CARE IF:  You have increased chest pain or pain that spreads to your arm, neck, jaw, back, or abdomen. You have shortness of breath. You have an increasing cough, or you cough up blood. You have severe back or abdominal pain. You feel nauseous or vomit. You have severe weakness. You faint. You have chills. This is an emergency. Do not wait to see if the pain will go away. Get medical help at once. Call your local emergency services (911 in [place] Do not drive yourself to the hospital. MAKE SURE YOU:  Understand these instructions. Will watch your condition. Will get help right away if you are not doing well or get worse. Document Released: 08/25/2005 Document Revised: 11/20/2013 Document Reviewed: 06/20/2008 [facility] Patient Information 2015 [place], [place]. This information is not intended to replace advice given to you by your health care provider. Make sure you discuss any questions you have with your health care provider.

## 2017-10-28 NOTE — ED Provider Notes (Signed)
Emergency Department Provider Note   I have reviewed the triage vital signs and the nursing notes.   HISTORY  Chief Complaint Shortness of Breath   HPI Martin Barrett is a 36 y.o. male with PMH of asthma since to the emergency department for evaluation of sudden onset chest tightness with difficulty breathing and diaphoresis.  Patient states that approximately 6 PM he donated plasma which she does frequently.  Is riding the bus home when the above symptoms began suddenly.  He describes his chest discomfort as a tightness and occurring diffusely over his chest.  No pleuritic or exertional component to pain.  No fevers or chills.  No hemoptysis.  No similar symptoms in the past. No modifying factors.    Past Medical History:  Diagnosis Date  . Asthma     There are no active problems to display for this patient.   Past Surgical History:  Procedure Laterality Date  . FACIAL COSMETIC SURGERY    . HERNIA REPAIR      Current Outpatient Rx  . Order #: [id number] Class: Historical Med  . Order #: [id number] Class: Print  . Order #: [id number] Class: Print  . Order #: [id number] Class: Print  . Order #: [id number] Class: Print    Allergies Patient has no known allergies.  Family History  Problem Relation Age of Onset  . Diabetes Mother   . Hypertension Mother     Social History Social History   Tobacco Use  . Smoking status: Current Every Day Smoker  Substance Use Topics  . Alcohol use: No  . Drug use: No    Review of Systems  Constitutional: No fever/chills Eyes: No visual changes. ENT: No sore throat. Cardiovascular: Positive chest pain. Respiratory: Positive shortness of breath. Gastrointestinal: No abdominal pain.  No nausea, no vomiting.  No diarrhea.  No constipation. Genitourinary: Negative for dysuria. Musculoskeletal: Negative for back pain. Skin: Negative for rash. Neurological: Negative for headaches, focal weakness or numbness.  10-point ROS otherwise  negative.  ____________________________________________   PHYSICAL EXAM:  VITAL SIGNS: ED Triage Vitals  Enc Vitals Group     BP 10/28/17 1919 119/84     Pulse Rate 10/28/17 1919 93     Resp 10/28/17 1919 18     Temp --      Temp src --      SpO2 10/28/17 1919 99 %     Weight 10/28/17 1909 187 lb (84.8 kg)     Height 10/28/17 1909 6' (1.829 m)   Constitutional: Alert and oriented. Appears uncomfortable.  Eyes: Conjunctivae are normal. Head: Atraumatic. Nose: No congestion/rhinnorhea. Mouth/Throat: Mucous membranes are moist.  Oropharynx non-erythematous. Neck: No stridor.  Cardiovascular: Normal rate, regular rhythm. Good peripheral circulation. Grossly normal heart sounds.   Respiratory: Normal respiratory effort.  No retractions. Lungs CTAB. Gastrointestinal: Soft and nontender. No distention.  Musculoskeletal: No lower extremity tenderness nor edema. No gross deformities of extremities. Neurologic:  Normal speech and language. No gross focal neurologic deficits are appreciated.  Skin:  Skin is warm, dry and intact. No rash noted. ____________________________________________   LABS (all labs ordered are listed, but only abnormal results are displayed)  Left AMA prior to draw  ____________________________________________  EKG   EKG Interpretation  Date/Time:  Friday October 28 2017 18:52:17 EST Ventricular Rate:  87 PR Interval:  114 QRS Duration: 72 QT Interval:  330 QTC Calculation: 397 R Axis:   95 Text Interpretation:  Normal sinus rhythm Rightward axis Borderline ECG No STEMI.  Confirmed by  Alona BeneLong, Joshua ([id number]) on 10/28/2017 7:16:08 PM       ____________________________________________  RADIOLOGY  Left AMA prior to radiology ____________________________________________   PROCEDURES  Procedure(s) performed:   Procedures  None ____________________________________________   INITIAL IMPRESSION / ASSESSMENT AND PLAN / ED COURSE  Pertinent  labs & imaging results that were available during my care of the patient were reviewed by me and considered in my medical decision making (see chart for details).  Patient presents to the emergency department for evaluation of chest pain, shortness of breath which occurred suddenly while riding the bus home from donating plasma.  His lungs are clear to auscultation bilaterally.  He has normal oxygen saturation on RA.  Plan for troponin, d-dimer with elevated heart rate, and reassess.  Patient made me aware that he is planning to leave AMA. He verbalizes understanding that we could be missing a heart attack, PE, dissection, or other life threatening event. Patient understands that he can return to the ED at any time.   ____________________________________________  FINAL CLINICAL IMPRESSION(S) / ED DIAGNOSES  Final diagnoses:  Precordial chest pain  Shortness of breath     MEDICATIONS GIVEN DURING THIS VISIT:  Medications  sodium chloride 0.9 % bolus 1,000 mL (0 mLs Intravenous Stopped 10/28/17 1955)  aspirin chewable tablet 324 mg (324 mg Oral Given 10/28/17 1934)    Note:  This document was prepared using Dragon voice recognition software and may include unintentional dictation errors.  Alona BeneJoshua Long, MD Emergency Medicine    Long, Arlyss RepressJoshua G, MD 10/29/17 [id number]

## 2017-10-28 NOTE — ED Notes (Signed)
Nurse will draw labs. 

## 2017-10-28 NOTE — ED Notes (Signed)
Dr. Jacqulyn Bath ( EDP ) notified that pt. refused blood tests and wanted to go home . EDP explained risk /plan of care to pt. Pt. Signed AMA form .

## 2017-10-28 NOTE — ED Triage Notes (Signed)
Per EMS: Pt gave plasma at @1800 . Pt took the bus home and became having SOB, CP, and diaphoresis.  Pt extremities are cold and it was hard for EMS to get SPO2.  Pt given 500 NS.  A&Ox4

## 2018-08-30 ENCOUNTER — Other Ambulatory Visit: Payer: Self-pay

## 2018-08-30 ENCOUNTER — Encounter (HOSPITAL_COMMUNITY): Payer: Self-pay | Admitting: Emergency Medicine

## 2018-08-30 ENCOUNTER — Emergency Department (HOSPITAL_COMMUNITY)
Admission: EM | Admit: 2018-08-30 | Discharge: 2018-08-30 | Disposition: A | Discharge status: Home / Self Care | Payer: Self-pay | Attending: Emergency Medicine | Admitting: Emergency Medicine

## 2018-08-30 DIAGNOSIS — F17210 Nicotine dependence, cigarettes, uncomplicated: Secondary | ICD-10-CM | POA: Insufficient documentation

## 2018-08-30 DIAGNOSIS — F1210 Cannabis abuse, uncomplicated: Secondary | ICD-10-CM | POA: Insufficient documentation

## 2018-08-30 DIAGNOSIS — R509 Fever, unspecified: Secondary | ICD-10-CM | POA: Insufficient documentation

## 2018-08-30 DIAGNOSIS — R51 Headache: Secondary | ICD-10-CM | POA: Insufficient documentation

## 2018-08-30 DIAGNOSIS — J45909 Unspecified asthma, uncomplicated: Secondary | ICD-10-CM | POA: Insufficient documentation

## 2018-08-30 DIAGNOSIS — K0889 Other specified disorders of teeth and supporting structures: Secondary | ICD-10-CM | POA: Insufficient documentation

## 2018-08-30 DIAGNOSIS — M542 Cervicalgia: Secondary | ICD-10-CM | POA: Insufficient documentation

## 2018-08-30 MED ORDER — KETOROLAC TROMETHAMINE 30 MG/ML IJ SOLN
30.0000 mg | Freq: Once | INTRAMUSCULAR | Status: AC
  Administered 2018-08-30: 30 mg via INTRAMUSCULAR
  Filled 2018-08-30: qty 1

## 2018-08-30 MED ORDER — PENICILLIN V POTASSIUM 500 MG PO TABS
500.0000 mg | ORAL_TABLET | Freq: Once | ORAL | Status: AC
Start: 2018-08-30 — End: 2018-08-30
  Administered 2018-08-30: 500 mg via ORAL
  Filled 2018-08-30: qty 1

## 2018-08-30 MED ORDER — PENICILLIN V POTASSIUM 500 MG PO TABS: 500.0000 mg | ORAL_TABLET | Freq: Four times a day (QID) | ORAL | 0 refills | Status: AC

## 2018-08-30 MED ORDER — ACETAMINOPHEN 500 MG PO TABS: 1000.0000 mg | ORAL_TABLET | Freq: Three times a day (TID) | ORAL | 0 refills | Status: DC | PRN

## 2018-08-30 MED ORDER — NAPROXEN 500 MG PO TABS: 500.0000 mg | ORAL_TABLET | Freq: Two times a day (BID) | ORAL | 0 refills | Status: DC

## 2018-08-30 NOTE — ED Provider Notes (Addendum)
[facility]-EMERGENCY DEPT Provider Note   CSN: [id number] Arrival date & time: 08/30/18  0530     History   Chief Complaint Chief Complaint  Patient presents with  . Dental Pain    HPI Martin Barrett is a 37 y.o. male with history of asthma who presents with dental pain that has been ongoing for a while, however got increasingly worse over the past few days.  Patient reports he has been taking Excedrin, Tylenol, and intermittently Motrin.  He has had radiation of pain to his eye and giving him a headache.  He reports subjective fevers at home.  He also has pain radiating into his neck, but no masses.  HPI  Past Medical History:  Diagnosis Date  . Asthma     There are no active problems to display for this patient.   Past Surgical History:  Procedure Laterality Date  . FACIAL COSMETIC SURGERY    . HERNIA REPAIR          Home Medications    Prior to Admission medications   Medication Sig Start Date End Date Taking? Authorizing Provider  acetaminophen (TYLENOL) 500 MG tablet Take 2 tablets (1,000 mg total) by mouth every 8 (eight) hours as needed. 08/30/18   Alanni Vader, Waylan Boga, PA-C  albuterol (PROVENTIL HFA;VENTOLIN HFA) 108 (90 BASE) MCG/ACT inhaler Inhale 2 puffs into the lungs every 6 (six) hours as needed for wheezing.    [provider]  HYDROcodone-acetaminophen (NORCO/VICODIN) 5-325 MG tablet Take 1-2 tablets by mouth every 6 hours as needed for pain and/or cough. 08/07/16   Pisciotta, Joni Reining, PA-C  naproxen (NAPROSYN) 500 MG tablet Take 1 tablet (500 mg total) by mouth 2 (two) times daily. 08/30/18   Emi Holes, PA-C  ondansetron (ZOFRAN ODT) 4 MG disintegrating tablet 4mg  ODT q4 hours prn nausea/vomit 01/10/17   Bethann Berkshire, MD  ondansetron (ZOFRAN) 4 MG tablet Take 1 tablet (4 mg total) by mouth every 8 (eight) hours as needed for nausea or vomiting. 08/07/16   Pisciotta, Joni Reining, PA-C  oseltamivir (TAMIFLU) 75 MG capsule Take 1  capsule (75 mg total) by mouth every 12 (twelve) hours. 01/10/17   Bethann Berkshire, MD  penicillin v potassium (VEETID) 500 MG tablet Take 1 tablet (500 mg total) by mouth 4 (four) times daily for 7 days. 08/30/18 09/06/18  Emi Holes, PA-C    Family History Family History  Problem Relation Age of Onset  . Diabetes Mother   . Hypertension Mother     Social History Social History   Tobacco Use  . Smoking status: Current Every Day Smoker    Packs/day: 1.00  . Smokeless tobacco: Never Used  Substance Use Topics  . Alcohol use: No  . Drug use: Yes    Types: Marijuana     Allergies   Other and Strawberry (diagnostic)   Review of Systems Review of Systems  Constitutional: Negative for fever.  HENT: Positive for dental problem.   Musculoskeletal: Positive for neck pain.     Physical Exam Updated Vital Signs BP (!) 140/106 (BP Location: Left Arm)   Pulse (!) 102   Temp 98.1 F (36.7 C) (Oral)   Resp 16   SpO2 100%   Physical Exam  Constitutional: He appears well-developed and well-nourished. No distress.  Uncomfortable appearing  HENT:  Head: Normocephalic and atraumatic.  Mouth/Throat: Oropharynx is clear and moist. No trismus in the jaw. Abnormal dentition. Dental caries present. No dental abscesses or uvula swelling.  No oropharyngeal exudate, posterior oropharyngeal edema, posterior oropharyngeal erythema or tonsillar abscesses.    No submandibular masses  Eyes: Pupils are equal, round, and reactive to light. Conjunctivae are normal. Right eye exhibits no discharge. Left eye exhibits no discharge. No scleral icterus.  Neck: Normal range of motion. Neck supple. No neck rigidity. No thyromegaly present.  Cardiovascular: Normal rate, regular rhythm, normal heart sounds and intact distal pulses. Exam reveals no gallop and no friction rub.  No murmur heard. Pulmonary/Chest: Effort normal and breath sounds normal. No stridor. No respiratory distress. He has no  wheezes. He has no rales.  Musculoskeletal: He exhibits no edema.  Lymphadenopathy:    He has no cervical adenopathy (tenderness on the left, no palpable masses).  Neurological: He is alert. Coordination normal.  Skin: Skin is warm and dry. No rash noted. He is not diaphoretic. No pallor.  Psychiatric: He has a normal mood and affect.  Nursing note and vitals reviewed.    ED Treatments / Results  Labs (all labs ordered are listed, but only abnormal results are displayed) Labs Reviewed - No data to display  EKG None  Radiology No results found.  Procedures Procedures (including critical care time)  Medications Ordered in ED Medications  ketorolac (TORADOL) 30 MG/ML injection 30 mg (30 mg Intramuscular Given 08/30/18 0618)  penicillin v potassium (VEETID) tablet 500 mg (500 mg Oral Given 08/30/18 8756)     Initial Impression / Assessment and Plan / ED Course  I have reviewed the triage vital signs and the nursing notes.  Pertinent labs & imaging results that were available during my care of the patient were reviewed by me and considered in my medical decision making (see chart for details).     Patient with dentalgia at site of chronically appearing decay. Erythema surrounding gingiva, however.  No abscess requiring immediate incision and drainage.  Exam not concerning for Ludwig's angina or pharyngeal abscess.  Will treat with penicillin, naprosyn, Tylenol.  Toradol given in the ED for pain.  Pt instructed to follow-up with dentist. Given resources. Discussed return precautions. Patient understands and agrees with plan. Patient vitals stable throughout ED course and discharged in satisfactory condition.   Final Clinical Impressions(s) / ED Diagnoses   Final diagnoses:  Pain, dental    ED Discharge Orders         Ordered    naproxen (NAPROSYN) 500 MG tablet  2 times daily     08/30/18 0624    acetaminophen (TYLENOL) 500 MG tablet  Every 8 hours PRN     08/30/18 0624      penicillin v potassium (VEETID) 500 MG tablet  4 times daily     08/30/18 0624               Emi Holes, PA-C 08/30/18 4332    Shon Baton, MD 08/30/18 [phone]

## 2018-08-30 NOTE — Discharge Instructions (Addendum)
Take Naprosyn and Tylenol as prescribed, as needed for your pain.  Do not combine with medications over-the-counter.  Take penicillin until completed.  It is important you finish all this medication.  Please follow-up with the dentist as soon as possible, as the definitive treatment for your dental pain.  The pain will keep returning until it is taken care of by a dentist.  Attached are resources.  Please return the emergency department if you develop any new or worsening symptoms including masses in your neck or in your throat, or any other concerning symptoms.

## 2018-08-30 NOTE — ED Triage Notes (Signed)
Pt from home with c/o bottom left dental pain that has been ongoing for "awhile" but increasingly bad x 3 days. Pt is afebrile.

## 2018-12-14 ENCOUNTER — Other Ambulatory Visit (INDEPENDENT_AMBULATORY_CARE_PROVIDER_SITE_OTHER): Payer: Self-pay | Admitting: Critical Care Medicine

## 2018-12-14 ENCOUNTER — Encounter (INDEPENDENT_AMBULATORY_CARE_PROVIDER_SITE_OTHER): Payer: Self-pay | Admitting: Critical Care Medicine

## 2018-12-14 ENCOUNTER — Encounter: Payer: Self-pay | Admitting: Critical Care Medicine

## 2018-12-14 DIAGNOSIS — W57XXXA Bitten or stung by nonvenomous insect and other nonvenomous arthropods, initial encounter: Secondary | ICD-10-CM

## 2018-12-14 MED ORDER — DIPHENHYDRAMINE HCL 25 MG PO TABS: 25.0000 mg | ORAL_TABLET | Freq: Four times a day (QID) | ORAL | 0 refills | Status: DC | PRN

## 2018-12-14 MED ORDER — HYDROXYZINE HCL 10 MG PO TABS: 10.0000 mg | ORAL_TABLET | Freq: Three times a day (TID) | ORAL | 0 refills | Status: DC | PRN

## 2018-12-14 MED ORDER — HYDROCORTISONE 2.5 % EX CREA: TOPICAL_CREAM | Freq: Two times a day (BID) | CUTANEOUS | 0 refills | Status: DC

## 2018-12-14 MED FILL — hydrOXYzine HCL 10 MG TABS: 10 | 7 days supply | Qty: 20 | Fill #0

## 2018-12-14 MED FILL — HYDROCORTISONE 2.5% CREAM: 2.5 | 14 days supply | Qty: 30 | Fill #0

## 2018-12-14 NOTE — Progress Notes (Signed)
   Subjective:    Patient ID: Martin Barrett, male    DOB: 07-May-1981, 38 y.o.   MRN: [id number]  38 y.o.M seen at [facility] for a rash.  Pt a guest and notes bed bugs in sheets.  Rash is diffuse   Rash  This is a new problem. The current episode started yesterday. The problem has been rapidly worsening since onset. The rash is diffuse. The rash is characterized by itchiness. He was exposed to an insect bite/sting. Pertinent negatives include no cough, fatigue or shortness of breath. Past treatments include nothing.      Review of Systems  Constitutional: Negative for fatigue.  Respiratory: Negative for cough and shortness of breath.   Skin: Positive for rash.       Objective:   Physical Exam  No acute distress  There were no vitals filed for this visit.  Gen: Pleasant, well-nourished, in no distress,  normal affect  ENT: No lesions,  mouth clear,  oropharynx clear, no postnasal drip  Neck: No JVD, no TMG, no carotid bruits  Lungs: No use of accessory muscles, no dullness to percussion, clear without rales or rhonchi  Cardiovascular: RRR, heart sounds normal, no murmur or gallops, no peripheral edema  Abdomen: soft and NT, no HSM,  BS normal  Musculoskeletal: No deformities, no cyanosis or clubbing  Neuro: alert, non focal  Skin: diffuse rash over arms and trunk.  Not on legs. C/w insect bite   No results found.       Assessment & Plan:  I personally reviewed all images and lab data in the [facility] system as well as any outside material available during this office visit and agree with the  radiology impressions.   Bed bug bite Bed bug bites  rx hydrocortisone cream, moisturizer, benadryl    [name] was seen today for rash.  Diagnoses and all orders for this visit:  Bedbug bite, initial encounter    I

## 2018-12-14 NOTE — Assessment & Plan Note (Signed)
Bed bug bites  rx hydrocortisone cream, moisturizer, benadryl

## 2019-01-02 ENCOUNTER — Emergency Department (HOSPITAL_COMMUNITY)
Admission: EM | Admit: 2019-01-02 | Discharge: 2019-01-02 | Disposition: A | Discharge status: Home / Self Care | Payer: Self-pay | Attending: Emergency Medicine | Admitting: Emergency Medicine

## 2019-01-02 ENCOUNTER — Other Ambulatory Visit: Payer: Self-pay

## 2019-01-02 ENCOUNTER — Encounter (HOSPITAL_COMMUNITY): Payer: Self-pay | Admitting: Emergency Medicine

## 2019-01-02 DIAGNOSIS — J45909 Unspecified asthma, uncomplicated: Secondary | ICD-10-CM | POA: Insufficient documentation

## 2019-01-02 DIAGNOSIS — R21 Rash and other nonspecific skin eruption: Secondary | ICD-10-CM | POA: Insufficient documentation

## 2019-01-02 DIAGNOSIS — F17210 Nicotine dependence, cigarettes, uncomplicated: Secondary | ICD-10-CM | POA: Insufficient documentation

## 2019-01-02 DIAGNOSIS — Z79899 Other long term (current) drug therapy: Secondary | ICD-10-CM | POA: Insufficient documentation

## 2019-01-02 LAB — GROUP A STREP BY PCR: GROUP A STREP BY PCR: NOT DETECTED

## 2019-01-02 MED ORDER — TRIAMCINOLONE ACETONIDE 0.1 % EX CREA: 1.0000 "application " | TOPICAL_CREAM | Freq: Two times a day (BID) | CUTANEOUS | 0 refills | Status: DC

## 2019-01-02 MED ORDER — DIPHENHYDRAMINE HCL 25 MG PO CAPS
25.0000 mg | ORAL_CAPSULE | Freq: Once | ORAL | Status: AC
  Administered 2019-01-02: 25 mg via ORAL
  Filled 2019-01-02: qty 1

## 2019-01-02 NOTE — ED Provider Notes (Signed)
[facility]-EMERGENCY DEPT Provider Note   CSN: [id number] Arrival date & time: 01/02/19  1519     History   Chief Complaint Chief Complaint  Patient presents with  . Rash    HPI Martin Barrett is a 38 y.o. male.  HPI   38 year old male presents with a week and a half history of a rash.  Patient states symptoms started after he moved into a shelter.  He tried taking penicillin after the rash started to see if that improved his rash but states no improvement.  He states symptoms started on his bilateral upper extremities but have extended to his chest and back.  He states rash is pruritic.  He denies any fevers, chills, nausea, vomiting, abdominal pain, sore throat, cough, sputum production.  Denies any known tick bites.  Besides penicillin he has not had any other antibiotics.  He has taken penicillin before without any rashes.  Past Medical History:  Diagnosis Date  . Asthma     Patient Active Problem List   Diagnosis Date Noted  . Bed bug bite 12/14/2018    Past Surgical History:  Procedure Laterality Date  . FACIAL COSMETIC SURGERY    . HERNIA REPAIR          Home Medications    Prior to Admission medications   Medication Sig Start Date End Date Taking? Authorizing Provider  acetaminophen (TYLENOL) 500 MG tablet Take 2 tablets (1,000 mg total) by mouth every 8 (eight) hours as needed. 08/30/18   Law, Waylan Boga, PA-C  albuterol (PROVENTIL HFA;VENTOLIN HFA) 108 (90 BASE) MCG/ACT inhaler Inhale 2 puffs into the lungs every 6 (six) hours as needed for wheezing.    [provider]  hydrocortisone 2.5 % cream Apply topically 2 (two) times daily. To affected areas,  Mix with skin moisturizer 12/14/18   Storm Frisk, MD  hydrOXYzine (ATARAX/VISTARIL) 10 MG tablet Take 1 tablet (10 mg total) by mouth 3 (three) times daily as needed. 12/14/18   Storm Frisk, MD    Family History Family History  Problem Relation Age of Onset  .  Diabetes Mother   . Hypertension Mother     Social History Social History   Tobacco Use  . Smoking status: Current Every Day Smoker    Packs/day: 1.00  . Smokeless tobacco: Never Used  Substance Use Topics  . Alcohol use: No  . Drug use: Yes    Types: Marijuana     Allergies   Other and Strawberry (diagnostic)   Review of Systems Review of Systems  Constitutional: Negative for chills and fever.  HENT: Negative for rhinorrhea, sore throat and trouble swallowing.   Respiratory: Negative for shortness of breath.   Cardiovascular: Negative for chest pain.  Gastrointestinal: Negative for abdominal pain, nausea and vomiting.  Skin: Positive for rash. Negative for wound.     Physical Exam Updated Vital Signs BP 122/85   Pulse 86   Temp 98.9 F (37.2 C) (Oral)   Resp 18   SpO2 99%   Physical Exam Vitals signs and nursing note reviewed.  Constitutional:      Appearance: He is well-developed.  HENT:     Head: Normocephalic and atraumatic.     Mouth/Throat:     Mouth: Mucous membranes are moist. No oral lesions or angioedema.     Pharynx: Posterior oropharyngeal erythema (mildly) present. No pharyngeal swelling, oropharyngeal exudate or uvula swelling.     Tonsils: No tonsillar exudate.  Eyes:  Conjunctiva/sclera: Conjunctivae normal.  Neck:     Musculoskeletal: Neck supple.  Cardiovascular:     Rate and Rhythm: Normal rate and regular rhythm.     Heart sounds: Normal heart sounds. No murmur.  Pulmonary:     Effort: Pulmonary effort is normal. No respiratory distress.     Breath sounds: Normal breath sounds. No wheezing or rales.  Abdominal:     General: Bowel sounds are normal. There is no distension.     Palpations: Abdomen is soft.     Tenderness: There is no abdominal tenderness.  Musculoskeletal: Normal range of motion.        General: No tenderness or deformity.  Skin:    General: Skin is warm and dry.     Findings: Rash present. No erythema.      Comments: Patient has a faint erythematous macular rash on bilateral upper extremities, chest, abdomen and back.  No petechiae or purpura, no vesicles or pustules.  No involvement of the palms of the hand.  Area  Neurological:     Mental Status: He is alert and oriented to person, place, and time.  Psychiatric:        Behavior: Behavior normal.      ED Treatments / Results  Labs (all labs ordered are listed, but only abnormal results are displayed) Labs Reviewed  GROUP A STREP BY PCR    EKG None  Radiology No results found.  Procedures Procedures (including critical care time)  Medications Ordered in ED Medications - No data to display   Initial Impression / Assessment and Plan / ED Course  I have reviewed the triage vital signs and the nursing notes.  Pertinent labs & imaging results that were available during my care of the patient were reviewed by me and considered in my medical decision making (see chart for details).     Discussed rash with attending, Dr. Rodena Medin.  He recommends a rapid strep.  If rapid strep is positive, treat with azithromycin.  If rapid strep is negative, recommends topical steroid and follow-up outpatient.   Rapid strep came back negative.  Since rash is nonspecific.  Could be viral exanthem versus contact dermatitis from his new environment.  Recommended Benadryl as needed for itching and topical hydrocortisone cream.  There is no petechiae or purpura, vesicles or pustules.  No target lesions.  Patient is afebrile, not tachycardic.  He is ready and stable for discharge.   At this time there does not appear to be any evidence of an acute emergency medical condition and the patient appears stable for discharge with appropriate outpatient follow up.Diagnosis was discussed with patient who verbalizes understanding and is agreeable to discharge. Pt case discussed with Dr. Rodena Medin who agrees with my plan.  Final Clinical Impressions(s) / ED Diagnoses    Final diagnoses:  None    ED Discharge Orders    None       Rueben Bash 01/02/19 2109    Wynetta Fines, MD 01/06/19 2342

## 2019-01-02 NOTE — Discharge Instructions (Signed)
Take over-the-counter Benadryl as needed for itching.  Apply over-the-counter hydrocortisone cream to affected area twice daily.  If the hydrocortisone cream does not improve your symptoms, may try triamcinolone cream.  Take showers regularly and avoid new detergents or lotions.  Return to the ED immediately for new or worsening symptoms or concerns, such as fevers, worsening rash, abdominal pain, nausea vomiting, chest pain, shortness of breath or any concerns at all.

## 2019-01-02 NOTE — ED Triage Notes (Signed)
Patient c/o itching and burning rash to bilateral arms and torso x11 days. Currently staying in a shelter.

## 2022-10-12 DIAGNOSIS — Z419 Encounter for procedure for purposes other than remedying health state, unspecified: Secondary | ICD-10-CM

## 2022-10-12 LAB — GLUCOSE, POCT (MANUAL RESULT ENTRY): POC Glucose: 105 mg/dl — AB (ref 70–99)
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
## 2022-10-25 ENCOUNTER — Ambulatory Visit: Payer: Self-pay | Admitting: Nurse Practitioner

## 2022-11-25 NOTE — Congregational Nurse Program (Unsigned)
  Dept: [phone]   Congregational Nurse Program Note  Date of Encounter: 10/12/2022  Clinic visit to establish a PCP and check blood pressure. BP 117/83, appointment made for 10/25/2022 with Dr. Geoffery Spruce.  Past Medical History: Past Medical History:  Diagnosis Date   Asthma     Encounter Details:

## 2022-12-12 ENCOUNTER — Other Ambulatory Visit: Payer: Self-pay

## 2022-12-12 ENCOUNTER — Emergency Department (HOSPITAL_COMMUNITY)
Admission: EM | Admit: 2022-12-12 | Discharge: 2022-12-13 | Disposition: A | Discharge status: Home / Self Care | Payer: Commercial Managed Care - HMO | Attending: Emergency Medicine | Admitting: Emergency Medicine

## 2022-12-12 DIAGNOSIS — R202 Paresthesia of skin: Secondary | ICD-10-CM | POA: Diagnosis not present

## 2022-12-12 DIAGNOSIS — R519 Headache, unspecified: Secondary | ICD-10-CM | POA: Insufficient documentation

## 2022-12-12 DIAGNOSIS — R531 Weakness: Secondary | ICD-10-CM | POA: Diagnosis present

## 2022-12-12 DIAGNOSIS — J45909 Unspecified asthma, uncomplicated: Secondary | ICD-10-CM | POA: Insufficient documentation

## 2022-12-12 LAB — CBC WITH DIFFERENTIAL/PLATELET
Abs Immature Granulocytes: 0.03 10*3/uL (ref 0.00–0.07)
Basophils Absolute: 0 10*3/uL (ref 0.0–0.1)
Basophils Relative: 0 %
Eosinophils Absolute: 0.1 10*3/uL (ref 0.0–0.5)
Eosinophils Relative: 1 %
HCT: 39.7 % (ref 39.0–52.0)
Hemoglobin: 13.5 g/dL (ref 13.0–17.0)
Immature Granulocytes: 0 %
Lymphocytes Relative: 26 %
Lymphs Abs: 2.5 10*3/uL (ref 0.7–4.0)
MCH: 32.7 pg (ref 26.0–34.0)
MCHC: 34 g/dL (ref 30.0–36.0)
MCV: 96.1 fL (ref 80.0–100.0)
Monocytes Absolute: 0.6 10*3/uL (ref 0.1–1.0)
Monocytes Relative: 6 %
Neutro Abs: 6.5 10*3/uL (ref 1.7–7.7)
Neutrophils Relative %: 67 %
Platelets: 153 10*3/uL (ref 150–400)
RBC: 4.13 MIL/uL — ABNORMAL LOW (ref 4.22–5.81)
RDW: 13.2 % (ref 11.5–15.5)
WBC: 9.7 10*3/uL (ref 4.0–10.5)
nRBC: 0 % (ref 0.0–0.2)

## 2022-12-12 LAB — BASIC METABOLIC PANEL
Anion gap: 12 (ref 5–15)
BUN: 9 mg/dL (ref 6–20)
CO2: 20 mmol/L — ABNORMAL LOW (ref 22–32)
Calcium: 9.1 mg/dL (ref 8.9–10.3)
Chloride: 108 mmol/L (ref 98–111)
Creatinine, Ser: 1.07 mg/dL (ref 0.61–1.24)
GFR, Estimated: >60 mL/min (ref >60)
Glucose, Bld: 86 mg/dL (ref 70–99)
Potassium: 4.7 mmol/L (ref 3.5–5.1)
Sodium: 140 mmol/L (ref 135–145)

## 2022-12-12 NOTE — ED Triage Notes (Signed)
Pt BIB EMS for left sided weakness and slurred speech that has been going  for 6 months. Pt denies CP or SOB.    18G Left AC CBG 127 145/81 90

## 2022-12-12 NOTE — ED Provider Triage Note (Signed)
Emergency Medicine Provider Triage Evaluation Note  Martin Barrett , a 42 y.o. male  was evaluated in triage.  Pt complains of left arm weakness, left sided numbness has been ongoing for the past 6 to 8 months.  Does not have any PCP care, reports he got established with insurance approximately 4 months ago.  He reports not getting any better, not taking any medication for improvement in the symptoms.  No headache.  Review of Systems  Positive: Left arm weakness Negative: fever  Physical Exam  BP (!) 117/96   Pulse 80   Temp 98.6 F (37 C) (Oral)   Resp 20   Ht 5\' 9"  (1.753 m)   Wt 68 kg   SpO2 99%   BMI 22.15 kg/m  Gen:   Awake, no distress   Resp:  Normal effort  MSK:   Moves extremities without difficulty  Other:    Medical Decision Making  Medically screening exam initiated at 7:19 PM.  Appropriate orders placed.  Martin Barrett was informed that the remainder of the evaluation will be completed by another provider, this initial triage assessment does not replace that evaluation, and the importance of remaining in the ED until their evaluation is complete.  Here with weakness x 6 months.    Janeece Fitting, PA-C 12/12/22 1922

## 2022-12-13 ENCOUNTER — Emergency Department (HOSPITAL_COMMUNITY): Payer: Commercial Managed Care - HMO

## 2022-12-13 ENCOUNTER — Encounter (HOSPITAL_COMMUNITY): Payer: Self-pay
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
## 2022-12-31 ENCOUNTER — Telehealth: Payer: Self-pay | Admitting: Diagnostic Neuroimaging

## 2022-12-31 ENCOUNTER — Ambulatory Visit (INDEPENDENT_AMBULATORY_CARE_PROVIDER_SITE_OTHER): Payer: Commercial Managed Care - HMO | Admitting: Diagnostic Neuroimaging

## 2022-12-31 ENCOUNTER — Encounter: Payer: Self-pay | Admitting: Diagnostic Neuroimaging

## 2022-12-31 VITALS — BP 111/73 | HR 78 | Ht 69.0 in | Wt 150.0 lb

## 2022-12-31 DIAGNOSIS — R29898 Other symptoms and signs involving the musculoskeletal system: Secondary | ICD-10-CM | POA: Diagnosis not present

## 2022-12-31 DIAGNOSIS — H538 Other visual disturbances: Secondary | ICD-10-CM
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
## 2022-12-31 NOTE — Telephone Encounter (Signed)
[employer] sent to GI they obtain auth [id number] 

## 2022-12-31 NOTE — Patient Instructions (Signed)
-   check MRI brain, cervical spine w/wo - check labs

## 2023-01-01 LAB — ANCA PROFILE: Anti-MPO Antibodies: <0.2 units (ref 0.0–0.9)

## 2023-01-01 LAB — SJOGREN'S SYNDROME ANTIBODS(SSA + SSB): ENA SSB (LA) Ab: <0.2 AI (ref 0.0–0.9)

## 2023-01-01 LAB — QUANTIFERON-TB GOLD PLUS

## 2023-01-01 LAB — HIV ANTIBODY (ROUTINE TESTING W REFLEX): HIV Screen 4th Generation wRfx: NONREACTIVE

## 2023-01-03 LAB — ANCA PROFILE

## 2023-01-04 LAB — ANCA PROFILE
Anti-PR3 Antibodies: <0.2 units (ref 0.0–0.9)
C-ANCA: <1:20 {titer}
P-ANCA: <1:20 {titer}

## 2023-01-04 NOTE — Congregational Nurse Program (Signed)
  Dept: [phone]   Congregational Nurse Program Note  Date of Encounter: 01/04/2023  Clinic visit for follow-up from visit to [facility].  He is to have MRI of spine and brain and had not made required appointment. Assisted him to call [facility] imaging, appointment scheduled for 01/26/23 at 9:50A Past Medical History: Past Medical History:  Diagnosis Date   Asthma     Encounter Details:  CNP Questionnaire - 01/04/23 1455       Questionnaire   Ask client: Do you give verbal consent for me to treat you today? Yes    Student Assistance N/A    Location Patient [place]    Visit Setting with Client Organization    Patient Status Unknown   Has own apartment at [employer] or [place]    Insurance/Financial Assistance Referral N/A    Medication Have Medication Insecurities    Medical Provider No    Medical Referrals Made [facility]    Medical Appointment Made N/A    Recently w/o PCP, now 1st time PCP visit completed due to CNs referral or appointment made N/A    Food Have Food Insecurities    Transportation Need transportation assistance    Housing/Utilities N/A    Interpersonal Safety N/A    Interventions Advocate/Support;Navigate Healthcare System;Counsel;Educate;Case Management    Abnormal to Normal Screening Since Last CN Visit N/A    Screenings CN Performed N/A    Sent Client to Lab for: N/A    Did client attend any of the following based off CNs referral or appointments made? N/A    ED Visit Averted N/A    Life-Saving Intervention Made N/A

## 2023-01-04 NOTE — Congregational Nurse Program (Signed)
  Dept: [phone]   Congregational Nurse Program Note  Date of Encounter: 12/28/2022  Referred to clinic by [facility] manager, recent visual loss and weakness left side with frequent falls.  ER visit 12/12/22 with these complaints, has not had recommended neurological assessment. Call to [facility], made appointment for Friday, February 2. CM will arrange transportation to the appointment.   Past Medical History: Past Medical History:  Diagnosis Date   Asthma     Encounter Details:  CNP Questionnaire - 12/28/22 1525       Questionnaire   Ask client: Do you give verbal consent for me to treat you today? Yes    Student Assistance N/A    Location Patient [place]    Visit Setting with Client Organization    Patient Status Unknown    Sport and exercise psychologist or [place]    Insurance/Financial Assistance Referral N/A    Medication Have Medication Insecurities    Medical Provider No    Screening Referrals Made Annual Wellness Visit;[facility]    Medical Referrals Made [facility];Vaccination    Medical Appointment Made [facility]    Recently w/o PCP, now 1st time PCP visit completed due to CNs referral or appointment made N/A    Food Have Food Insecurities    Transportation Need transportation assistance    Housing/Utilities N/A    Interpersonal Safety N/A    Interventions Advocate/Support;Navigate Healthcare System;Counsel;Educate;Reviewed Medications    Abnormal to Normal Screening Since Last CN Visit N/A    Screenings CN Performed Blood Pressure;Pulse Ox;Weight    Sent Client to Lab for: N/A    Did client attend any of the following based off CNs referral or appointments made? N/A    ED Visit Averted N/A    Life-Saving Intervention Made N/A

## 2023-01-06 LAB — SJOGREN'S SYNDROME ANTIBODS(SSA + SSB): ENA SSA (RO) Ab: <0.2 AI (ref 0.0–0.9)

## 2023-01-10 LAB — RPR: RPR Ser Ql: NONREACTIVE

## 2023-01-10 LAB — HEPATITIS B SURFACE ANTIBODY,QUALITATIVE: Hep B Surface Ab, Qual: NONREACTIVE

## 2023-01-10 LAB — HEPATITIS B CORE ANTIBODY, TOTAL: Hep B Core Total Ab: NEGATIVE

## 2023-01-10 LAB — ANA,IFA RA DIAG PNL W/RFLX TIT/PATN: Cyclic Citrullin Peptide Ab: 6 units (ref 0–19)

## 2023-01-11 ENCOUNTER — Telehealth: Payer: Self-pay | Admitting: Diagnostic Neuroimaging

## 2023-01-11 DIAGNOSIS — M79604 Pain in right leg: Secondary | ICD-10-CM

## 2023-01-11 LAB — MULTIPLE MYELOMA PANEL, SERUM
Albumin SerPl Elph-Mcnc: 4.3 g/dL (ref 2.9–4.4)
Albumin/Glob SerPl: 1.4 (ref 0.7–1.7)
Alpha 1: 0.3 g/dL (ref 0.0–0.4)
Alpha2 Glob SerPl Elph-Mcnc: 0.7 g/dL (ref 0.4–1.0)
B-Globulin SerPl Elph-Mcnc: 1.2 g/dL (ref 0.7–1.3)
Gamma Glob SerPl Elph-Mcnc: 1.1 g/dL (ref 0.4–1.8)
Globulin, Total: 3.3 g/dL (ref 2.2–3.9)
IgA/Immunoglobulin A, Serum: 365 mg/dL (ref 90–386)
IgG (Immunoglobin G), Serum: 1022 mg/dL (ref 603–1613)
IgM (Immunoglobulin M), Srm: 88 mg/dL (ref 20–172)
Total Protein: 7.6 g/dL (ref 6.0–8.5)

## 2023-01-11 LAB — STRATIFY JCV(TM) AB W/INDEX
JCV Antibody: POSITIVE — AB
JCV Index Value: 3.59

## 2023-01-11 LAB — QUANTIFERON-TB GOLD PLUS
QuantiFERON Mitogen Value: >10 IU/mL
QuantiFERON Nil Value: 0 IU/mL
QuantiFERON TB1 Ag Value: 0 IU/mL
QuantiFERON TB2 Ag Value: 0.01 IU/mL
QuantiFERON-TB Gold Plus: NEGATIVE

## 2023-01-11 LAB — HEPATITIS C ANTIBODY: Hep C Virus Ab: NONREACTIVE

## 2023-01-11 LAB — ANGIOTENSIN CONVERTING ENZYME: Angio Convert Enzyme: 42 U/L (ref 14–82)

## 2023-01-11 LAB — ANA,IFA RA DIAG PNL W/RFLX TIT/PATN
ANA Titer 1: NEGATIVE
Rhuematoid fact SerPl-aCnc: <10 IU/mL (ref <14.0)

## 2023-01-11 LAB — IMMUNOGLOBULINS A/E/G/M, SERUM: IgE (Immunoglobulin E), Serum: 22 IU/mL (ref 6–495)

## 2023-01-11 LAB — HEPATITIS B SURFACE ANTIGEN: Hepatitis B Surface Ag: NEGATIVE

## 2023-01-11 LAB — VITAMIN B6: Vitamin B6: 6.4 ug/L (ref 3.4–65.2)

## 2023-01-11 LAB — HEMOGLOBIN A1C
Est. average glucose Bld gHb Est-mCnc: 111 mg/dL
Hgb A1c MFr Bld: 5.5 % (ref 4.8–5.6)

## 2023-01-11 LAB — VITAMIN B1: Thiamine: 79.1 nmol/L (ref 66.5–200.0)

## 2023-01-11 LAB — TSH: TSH: 0.651 u[IU]/mL (ref 0.450–4.500)

## 2023-01-11 LAB — VITAMIN B12: Vitamin B-12: 320 pg/mL (ref 232–1245)

## 2023-01-11 MED ORDER — GABAPENTIN 300 MG PO CAPS: 300.0000 mg | ORAL_CAPSULE | Freq: Two times a day (BID) | ORAL | 3 refills | Status: DC

## 2023-01-11 MED ORDER — CYCLOBENZAPRINE HCL 5 MG PO TABS
5.0000 mg | ORAL_TABLET | Freq: Three times a day (TID) | ORAL | 1 refills | Status: DC | PRN
  Filled 2023-02-08: qty 30, 5d supply, fill #0

## 2023-01-11 MED ORDER — CYCLOBENZAPRINE HCL 5 MG PO TABS: 5.0000 mg | ORAL_TABLET | Freq: Three times a day (TID) | ORAL | 1 refills | Status: DC | PRN

## 2023-01-11 NOTE — Addendum Note (Signed)
Addended by: Darleen Crocker on: 01/11/2023 04:28 PM   Modules accepted: Orders

## 2023-01-11 NOTE — Telephone Encounter (Signed)
Called the case manager back and advised Dr Leta Baptist was sending in two medications for the pt. She was appreciative for the call back.

## 2023-01-11 NOTE — Telephone Encounter (Signed)
Called Martin Barrett back. Pt has had increase in pain. The pain is constant and present in lower back, both knees and hips. Denies spasms. Over the counter tylenol is not helping. He is able to walk and is using a cane but is falling daily.  He is scheduled 01/26/23 for MRI's to be completed. Denies fever and vital signs have been stable. He has started complaining also of a toothache and he has an apt scheduled to evaluate this further on Thursday. In the meantime, she is asking if there is anything Dr Leta Baptist would recommend to help with the pain that he is having. Advised that we typically do not prescribe pain medication but advised I would mention this to Dr Leta Baptist to see if he has recommendation on what can be offered for the patient.  She states if after 5 or first thing tomorrow, we can call the case manager number which should be Martin Barrett, at [phone].

## 2023-01-11 NOTE — Congregational Nurse Program (Signed)
  Dept: [phone]   Congregational Nurse Program Note  Date of Encounter: 01/11/2023  Clinic visit for toothache and follow-up from neurology appointment.  BP 109/74, pulse 78 and regular, O2 Sat 97%. States he has fallen several times since last week despite using cane everyday, pain lower back, both knees and hips.  Left message for the nurse at [facility] to see if Dr. Stark Klein will prescribe pain medication.  Also, has toothache from wisdom tooth upper right, appointment made at [facility] for Thursday, February 16th at 1:00P.  Also has toothache right maxillary area.   Past Medical History: Past Medical History:  Diagnosis Date   Asthma     Encounter Details:  CNP Questionnaire - 01/11/23 1330       Questionnaire   Ask client: Do you give verbal consent for me to treat you today? Yes    Student Assistance N/A    Location Patient [place]    Visit Setting with Client Organization    Patient Status Unknown   Has own apartment at [employer] or [place]    Insurance/Financial Assistance Referral N/A    Medication Have Medication Insecurities    Medical Provider No    Screening Referrals Made N/A    Medical Referrals Made [facility]    Medical Appointment Made Dental    Recently w/o PCP, now 1st time PCP visit completed due to CNs referral or appointment made N/A    Food Have Food Insecurities    Transportation Need transportation assistance    Housing/Utilities N/A    Interpersonal Safety N/A    Interventions Advocate/Support;Navigate Healthcare System;Counsel;Educate;Case Management;Spiritual Care;Reviewed Medications    Abnormal to Normal Screening Since Last CN Visit N/A    Screenings CN Performed Blood Pressure;Pulse Ox    Sent Client to Lab for: N/A    Did client attend any of the following based off CNs referral or appointments made? N/A    ED Visit Averted N/A    Life-Saving  Intervention Made N/A

## 2023-01-11 NOTE — Telephone Encounter (Signed)
Trial of gabapentin and cyclobenzaprine.  Meds ordered this encounter  Medications   cyclobenzaprine (FLEXERIL) 5 MG tablet    Sig: Take 1-2 tablets (5-10 mg total) by mouth 3 (three) times daily as needed for muscle spasms.    Dispense:  30 tablet    Refill:  1   gabapentin (NEURONTIN) 300 MG capsule    Sig: Take 1 capsule (300 mg total) by mouth in the morning and at bedtime.    Dispense:  60 capsule    Refill:  [place], MD [id number], [id number] PM Certified in Neurology, Neurophysiology and [facility] [address] [place], [place] [address] [phone]

## 2023-01-11 NOTE — Telephone Encounter (Signed)
[facility] is calling. Wanting to know if Dr. Leta Baptist will write a prescription for pt to get some pain medication. Stated his legs are getting weaker and he is falling a lot.

## 2023-01-18 NOTE — Congregational Nurse Program (Signed)
  Dept: [phone]   Congregational Nurse Program Note  Date of Encounter: 01/18/2023  Clinic visit to follow-up from neurology and dental appointments.  Scheduled transportation to [place] on 01/26/23.  He was unable to see the dentist d/t insurance not covering dental work,  Financial risk analyst with case manager at apartment office to find dental services that he can afford. BP 135/69, pulse 88 and regular, O2 Sat 99%. Past Medical History: Past Medical History:  Diagnosis Date   Asthma     Encounter Details:  CNP Questionnaire - 01/18/23 1410       Questionnaire   Ask client: Do you give verbal consent for me to treat you today? Yes    Student Assistance N/A    Location Patient [place]    Visit Setting with Client Organization    Patient Status Unknown   Has own apartment at [address] or [place]    Insurance/Financial Assistance Referral N/A    Medication Have Medication Insecurities    Medical Provider No    Screening Referrals Made N/A    Medical Referrals Made N/A    Medical Appointment Made N/A    Recently w/o PCP, now 1st time PCP visit completed due to CNs referral or appointment made N/A    Food Have Food Insecurities    Transportation Need transportation assistance;Provided transportation assistance    Housing/Utilities N/A    Interpersonal Safety N/A    Interventions Advocate/Support;Navigate Healthcare System;Counsel;Educate;Case Management;Spiritual Care;Reviewed Medications    Abnormal to Normal Screening Since Last CN Visit N/A    Screenings CN Performed Blood Pressure;Pulse Ox    Sent Client to Lab for: N/A    Did client attend any of the following based off CNs referral or appointments made? N/A    ED Visit Averted N/A    Life-Saving Intervention Made N/A

## 2023-01-26 ENCOUNTER — Ambulatory Visit
Admission: RE | Admit: 2023-01-26 | Discharge: 2023-01-26 | Disposition: A | Discharge status: Home / Self Care | Payer: Commercial Managed Care - HMO | Source: Ambulatory Visit | Attending: Diagnostic Neuroimaging | Admitting: Diagnostic Neuroimaging

## 2023-01-26 DIAGNOSIS — H538 Other visual disturbances: Secondary | ICD-10-CM

## 2023-01-26 DIAGNOSIS — R29898 Other symptoms and signs involving the musculoskeletal system: Secondary | ICD-10-CM

## 2023-01-26 MED ORDER — GADOPICLENOL 0.5 MMOL/ML IV SOLN
7.5000 mL | Freq: Once | INTRAVENOUS | Status: AC | PRN
  Administered 2023-01-26: 7.5 mL via INTRAVENOUS

## 2023-01-26 NOTE — Congregational Nurse Program (Signed)
  Dept: [phone]   Congregational Nurse Program Note  Date of Encounter: 01/25/2023  Clinic visit to review papers sent to him by health insurance company.  Has been authorized for visits to the neurologist through August 2024 and imaging scheduled for 2/28 that required prior authorization was approved.  Confirmed transportation arrangements made at previous clinic visit.  States he is falling more frequently even with using the cane.  Assisted with obtaining a walker from [employer]. Past Medical History: Past Medical History:  Diagnosis Date   Asthma     Encounter Details:  CNP Questionnaire - 01/25/23 1455       Questionnaire   Ask client: Do you give verbal consent for me to treat you today? Yes    Student Assistance N/A    Location Patient [place]    Visit Setting with Client Organization    Patient Status Unknown   Has own apartment at [address] or [place]    Insurance/Financial Assistance Referral N/A    Medication Have Medication Insecurities    Medical Provider No    Screening Referrals Made N/A    Medical Referrals Made N/A    Medical Appointment Made N/A    Recently w/o PCP, now 1st time PCP visit completed due to CNs referral or appointment made N/A    Food Have Food Insecurities;Referred to Food Pantry    Transportation Need transportation assistance    Housing/Utilities N/A    Interpersonal Safety N/A    Interventions Advocate/Support;Navigate Healthcare System;Counsel;Educate;Case Management;Spiritual Care    Abnormal to Normal Screening Since Last CN Visit N/A    Screenings CN Performed N/A    Sent Client to Lab for: N/A    Did client attend any of the following based off CNs referral or appointments made? N/A    ED Visit Averted N/A    Life-Saving Intervention Made N/A

## 2023-01-27 NOTE — Addendum Note (Signed)
Addended by: Penni Bombard on: 01/27/2023 06:13 PM   Modules accepted: Orders

## 2023-02-01 ENCOUNTER — Telehealth: Payer: Self-pay | Admitting: Diagnostic Neuroimaging

## 2023-02-01 NOTE — Telephone Encounter (Signed)
Called Juliann Pulse and she states patient started gabapentin but never got the flexiril. She is trying to help with getting that medication for him. Advised it would be beneficial to try. She was appreciative for the call back.

## 2023-02-01 NOTE — Telephone Encounter (Signed)
Arizona Constable is calling from [employer]. Stated she would like to speak with nurse about pt medication.

## 2023-02-01 NOTE — Congregational Nurse Program (Signed)
  Dept: [phone]   Congregational Nurse Program Note  Date of Encounter: 02/01/2023  Clinic visit for complaint of increased back pain with muscle spasms and weakness on left side.  Is taking Gabapentin but prescription for Flexeril was not filled d/t insurance not covering it per the [employer].  Will discuss medication issue with [facility] case Freight forwarder.  Reviewed results of imaging studies with resident and requested earlier appointment from [facility].  He is to call neurology office to check on cancellations to obtain an earlier tie to see the neurologist.     Past Medical History: Past Medical History:  Diagnosis Date   Asthma     Encounter Details:  CNP Questionnaire - 02/01/23 1330       Questionnaire   Ask client: Do you give verbal consent for me to treat you today? Yes    Student Assistance N/A    Location Patient [place]    Visit Setting with Client Organization    Patient Status Unknown   Has own apartment at [address] or [place]    Insurance/Financial Assistance Referral N/A    Medication Have Medication Insecurities    Medical Provider No    Screening Referrals Made N/A    Medical Referrals Made N/A    Medical Appointment Made N/A    Recently w/o PCP, now 1st time PCP visit completed due to CNs referral or appointment made N/A    Food Have Food Insecurities    Transportation Need transportation assistance    Housing/Utilities N/A    Interpersonal Safety N/A    Interventions Advocate/Support;Navigate Healthcare System;Counsel;Educate;Case Management;Spiritual Care    Abnormal to Normal Screening Since Last CN Visit N/A    Screenings CN Performed N/A    Sent Client to Lab for: N/A    Did client attend any of the following based off CNs referral or appointments made? N/A    ED Visit Averted N/A    Life-Saving Intervention Made N/A

## 2023-02-08 ENCOUNTER — Other Ambulatory Visit: Payer: Self-pay

## 2023-02-09 ENCOUNTER — Other Ambulatory Visit: Payer: Self-pay

## 2023-02-12 ENCOUNTER — Encounter (HOSPITAL_COMMUNITY): Payer: Self-pay

## 2023-02-12 ENCOUNTER — Emergency Department (HOSPITAL_COMMUNITY): Payer: Commercial Managed Care - HMO

## 2023-02-12 ENCOUNTER — Emergency Department (HOSPITAL_COMMUNITY)
Admission: EM | Admit: 2023-02-12 | Discharge: 2023-02-12 | Disposition: A | Discharge status: Home / Self Care | Payer: Commercial Managed Care - HMO | Attending: Emergency Medicine | Admitting: Emergency Medicine

## 2023-02-12 DIAGNOSIS — R531 Weakness: Secondary | ICD-10-CM | POA: Insufficient documentation

## 2023-02-12 HISTORY — DX: Cerebral infarction, unspecified: I63.9

## 2023-02-12 HISTORY — DX: Repeated falls: R29.6

## 2023-02-12 HISTORY — DX: Other speech disturbances: R47.89

## 2023-02-12 HISTORY — DX: Dorsalgia, unspecified: M54.9

## 2023-02-12 HISTORY — DX: Other visual disturbances: H53.8

## 2023-02-12 HISTORY — DX: Other abnormalities of gait and mobility: R26.89

## 2023-02-12 HISTORY — DX: Other lack of coordination: R27.8

## 2023-02-12 LAB — BASIC METABOLIC PANEL
Anion gap: 9 (ref 5–15)
BUN: 5 mg/dL — ABNORMAL LOW (ref 6–20)
CO2: 25 mmol/L (ref 22–32)
Calcium: 9 mg/dL (ref 8.9–10.3)
Chloride: 106 mmol/L (ref 98–111)
Creatinine, Ser: 1.16 mg/dL (ref 0.61–1.24)
GFR, Estimated: >60 mL/min (ref >60)
Glucose, Bld: 105 mg/dL — ABNORMAL HIGH (ref 70–99)
Potassium: 4.3 mmol/L (ref 3.5–5.1)
Sodium: 140 mmol/L (ref 135–145)

## 2023-02-12 LAB — CBC
HCT: 39 % (ref 39.0–52.0)
Hemoglobin: 13.1 g/dL (ref 13.0–17.0)
MCH: 32.7 pg (ref 26.0–34.0)
MCHC: 33.6 g/dL (ref 30.0–36.0)
MCV: 97.3 fL (ref 80.0–100.0)
Platelets: 199 10*3/uL (ref 150–400)
RBC: 4.01 MIL/uL — ABNORMAL LOW (ref 4.22–5.81)
RDW: 12.9 % (ref 11.5–15.5)
WBC: 6.9 10*3/uL (ref 4.0–10.5)
nRBC: 0 % (ref 0.0–0.2)
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
## 2023-02-12 NOTE — ED Triage Notes (Signed)
Pt from home BIB [facility] for bilateral leg weakness, tingling sensation and "feel weird" for a few days. Pt has difficulty ambulation, uses a cane which is not new. Pt denies leg pain or injury. Pt also reports concern for "slow speech" that has been going on for at least one year. No new symptoms tonight. EMS reports negative stroke screen, VSS, A&O x4.

## 2023-02-12 NOTE — Discharge Instructions (Addendum)
As discussed, today's evaluation has been reassuring in terms of acute new changes.  However, with your ongoing weakness it is very important that you continue to work with your neurologist.  Return here for concerning changes in your condition.

## 2023-02-15 ENCOUNTER — Telehealth: Payer: Self-pay | Admitting: Diagnostic Neuroimaging

## 2023-02-15 NOTE — Congregational Nurse Program (Signed)
  Dept: [phone]   Congregational Nurse Program Note  Date of Encounter: 02/08/2023  Resident visit for complaint of back pain and muscle spasms.  Determined that resident never filled gabapentin prescription d/t insurance expired.  Referred to [facility] case manager to assist with Medicaid application. 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 Yes    Student Assistance N/A    Location Patient [place]    Visit Setting with Client Organization    Patient Status Unknown   Has own apartment at [address] [employer] Uninsured ([place] Card/Care Connects/Self-Pay/Medicaid Family Planning)    Insurance/Financial Assistance Referral Medicaid    Medication Have Medication Insecurities    Medical Provider No    Screening Referrals Made N/A    Medical Referrals Made N/A    Medical Appointment Made N/A    Recently w/o PCP, now 1st time PCP visit completed due to CNs referral or appointment made N/A    Food Have Food Insecurities;Referred to Food Pantry    Transportation Need transportation assistance;Provided transportation assistance    Housing/Utilities N/A    Interpersonal Safety N/A    Interventions Sports coach;Case Management;Spiritual Care;Reviewed Medications    Abnormal to Normal Screening Since Last CN Visit N/A    Screenings CN Performed N/A    Sent  Client to Lab for: N/A    Did client attend any of the following based off CNs referral or appointments made? N/A    ED Visit Averted N/A    Life-Saving Intervention Made N/A

## 2023-02-15 NOTE — Telephone Encounter (Signed)
Martin Barrett a Scientist, research (physical sciences) from [employer] called from [employer] requesting to speak to the RN regarding an ER visit that the pt had on Sat for Numbness. Pt started Gabapentin on Wednesday but they don't think it has to do with the medication.

## 2023-02-15 NOTE — Congregational Nurse Program (Signed)
  Dept: [phone]   Congregational Nurse Program Note  Date of Encounter: 02/15/2023  Resident came to clinic to confirm transportation for procedure in AM.  Rescheduled pickup time for 1030A appointment.  Has complaint of numbness in legs and had to leave work early on last Friday, March 15.  Went to ER on 3/16 when symptoms didn't resolve, no change in treatment after brain scan showed no acute issue and is currently seeing neurologist.    Called to Dr. Hilbert Corrigan office and left message for nurse that resident began taking gabapentin on on March 13 and had ER visit on March 16. 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 Yes    Student Assistance N/A    Location Patient [place]    Visit Setting with Client Organization    Patient Status Unknown   Has own apartment at [employer] Uninsured ([place] Card/Care Connects/Self-Pay/Medicaid Family Planning)    Insurance/Financial Assistance Referral Medicaid    Medication Have Medication Insecurities    Medical Provider No    Screening Referrals Made N/A    Medical Referrals Made [facility] PCP/Clinic    Medical Appointment Made N/A    Recently w/o PCP, now 1st time PCP visit completed due to CNs referral or appointment made N/A    Food Have Food Insecurities    Transportation Need transportation assistance;Provided transportation assistance    Housing/Utilities N/A    Interpersonal Safety N/A    Interventions Advocate/Support;Navigate Healthcare System;Counsel;Educate;Case Management;Spiritual Care;Reviewed Medications    Abnormal to Normal Screening Since Last CN  Visit N/A    Screenings CN Performed N/A    Sent Client to Lab for: N/A    Did client attend any of the following based off CNs referral or appointments made? Food Pantry    ED Visit Averted N/A    Life-Saving Intervention Made N/A

## 2023-02-15 NOTE — Telephone Encounter (Signed)
Contacted Cathy back, LVM rq call back.

## 2023-02-15 NOTE — Telephone Encounter (Signed)
Martin Barrett returned call from nurse. Requesting a call back.

## 2023-02-15 NOTE — Telephone Encounter (Signed)
Contacted Cathy back again, LVM rq call back.

## 2023-02-16 ENCOUNTER — Ambulatory Visit
Admission: RE | Admit: 2023-02-16 | Discharge: 2023-02-16 | Disposition: A | Discharge status: Home / Self Care | Payer: Commercial Managed Care - HMO | Source: Ambulatory Visit | Attending: Diagnostic Neuroimaging | Admitting: Diagnostic Neuroimaging

## 2023-02-16 DIAGNOSIS — R29898 Other symptoms and signs involving the musculoskeletal system: Secondary | ICD-10-CM

## 2023-02-16 DIAGNOSIS — H538 Other visual disturbances: Secondary | ICD-10-CM

## 2023-02-16 NOTE — Progress Notes (Signed)
1 vial of blood drawn from pts RAC to be sent off with LP lab work. 1 successful attempt, pt tolerated well. Gauze and tape applied after.  

## 2023-02-16 NOTE — Discharge Instructions (Signed)

## 2023-02-16 NOTE — Telephone Encounter (Signed)
**  IF Martin Barrett returns call, please advise that we are able to see notes from the visit to the ER. The gabapentin is not the cause of his numbness, it is likely due to what was found on MRI. Pt should pursue completing the Lumbar puncture testing and once we have that results we will be able to determine course of treatment moving forward. If the numbness and weakness is causing him problems, we can see if iv or oral steroids can possibly be given.

## 2023-02-21 LAB — CNS IGG SYNTHESIS RATE, CSF+BLOOD
Albumin Serum: 4.6 g/dL (ref 3.6–5.1)
Albumin, CSF: 67.7 mg/dL — ABNORMAL HIGH (ref 8.0–42.0)
CNS-IgG Synthesis Rate: 12.3 mg/24 h — ABNORMAL HIGH (ref <=3.3)
IgG (Immunoglobin G), Serum: 1010 mg/dL (ref 600–1640)
IgG Total CSF: 9.7 mg/dL — ABNORMAL HIGH (ref 0.8–7.7)
IgG-Index: 0.65 (ref <0.70)

## 2023-02-21 LAB — CSF CELL COUNT WITH DIFFERENTIAL
RBC Count, CSF: 0 cells/uL
TOTAL NUCLEATED CELL: 2 cells/uL (ref 0–5)

## 2023-02-21 LAB — CSF CULTURE W GRAM STAIN
MICRO NUMBER:: 14717646
Result:: NO GROWTH
SPECIMEN QUALITY:: ADEQUATE

## 2023-02-21 LAB — OLIGOCLONAL BANDS, CSF + SERM

## 2023-02-21 LAB — PROTEIN, CSF: Total Protein, CSF: 133 mg/dL — ABNORMAL HIGH (ref 15–45)

## 2023-02-21 LAB — GLUCOSE, CSF: Glucose, CSF: 59 mg/dL (ref 40–80)

## 2023-02-22 ENCOUNTER — Telehealth: Payer: Self-pay | Admitting: Neurology

## 2023-02-22 NOTE — Congregational Nurse Program (Signed)
  Dept: [phone]   Congregational Nurse Program Note  Date of Encounter: 02/22/2023  Clinic visit for complaint of back pain with spasms and dizziness.  Also, decreased vision left eye with some blurred vision right eye. Using the walker at all times except inside his apartment uses cane. Has been asked not to return to work d/t falls until diagnosis obtained, called [facility]. The nurse at the clinic was trying to reach him for sn appointment to discuss results from the spinal tap,  appointment is Thursday at 1:00P. 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 Yes    Student Assistance N/A    Location Patient [place]    Visit Setting with Client Organization    Patient Status Unknown   Has own apartment at [employer] Uninsured ([place] Card/Care Connects/Self-Pay/Medicaid Family Planning)    Insurance/Financial Assistance Referral N/A    Medication Have Medication Insecurities    Medical Provider No    Screening Referrals Made N/A    Medical Referrals Made [facility] PCP/Clinic    Medical Appointment Made N/A    Recently w/o PCP, now 1st time PCP visit completed due to CNs referral or appointment made N/A    Food Have Food Insecurities    Transportation Need transportation assistance;Provided transportation assistance    Housing/Utilities N/A    Interpersonal Safety N/A    Interventions Advocate/Support;Navigate Healthcare System;Counsel;Educate;Case Management;Spiritual Care;Reviewed Medications    Abnormal to Normal Screening Since Last CN Visit N/A    Screenings CN Performed Blood  Pressure;Pulse Ox    Sent Client to Lab for: N/A    Did client attend any of the following based off CNs referral or appointments made? Medical    ED Visit Averted N/A    Life-Saving Intervention Made N/A

## 2023-02-22 NOTE — Telephone Encounter (Signed)
Pt called back. Scheduled him with Dr. Leta Baptist on 3/28 @ 1:30 pm.

## 2023-02-22 NOTE — Telephone Encounter (Signed)
Called the patient. There was no answer. LVM advising the patient had some results to review and needed to get him scheduled for a follow up apt. I have an apt on hold Thursday 02/24/23 at 1:30 pm.

## 2023-02-22 NOTE — Telephone Encounter (Signed)
-----   Message from Penni Bombard, MD sent at 02/21/2023  5:17 PM EDT ----- LP confirm autoimmune dz (likely multiple sclerosis). Please setup video visit / phone call to discuss further treatment options. -VRP

## 2023-02-24 ENCOUNTER — Encounter: Payer: Self-pay | Admitting: Neurology

## 2023-02-24 ENCOUNTER — Encounter: Payer: Self-pay | Admitting: Diagnostic Neuroimaging

## 2023-02-24 ENCOUNTER — Other Ambulatory Visit (HOSPITAL_COMMUNITY): Payer: Self-pay

## 2023-02-24 ENCOUNTER — Ambulatory Visit (INDEPENDENT_AMBULATORY_CARE_PROVIDER_SITE_OTHER): Payer: Commercial Managed Care - HMO | Admitting: Diagnostic Neuroimaging

## 2023-02-24 VITALS — BP 104/79 | HR 103 | Ht 69.0 in | Wt 154.0 lb

## 2023-02-24 DIAGNOSIS — G35 Multiple sclerosis: Secondary | ICD-10-CM | POA: Diagnosis not present

## 2023-02-24 MED ORDER — PREDNISONE 10 MG PO TABS
ORAL_TABLET | ORAL | 0 refills | Status: DC
  Filled 2023-02-24: qty 21, 6d supply, fill #0

## 2023-02-24 MED ORDER — CYCLOBENZAPRINE HCL 10 MG PO TABS
10.0000 mg | ORAL_TABLET | Freq: Two times a day (BID) | ORAL | 6 refills | Status: DC | PRN
  Filled 2023-02-24: qty 60, 30d supply, fill #0
  Filled 2023-03-22: qty 60, 30d supply, fill #1
  Filled 2023-04-07: qty 60, 30d supply, fill #2

## 2023-02-24 MED ORDER — GABAPENTIN 300 MG PO CAPS
300.0000 mg | ORAL_CAPSULE | Freq: Every day | ORAL | 6 refills | Status: DC
  Filled 2023-02-24: qty 60, 30d supply, fill #0
  Filled 2023-03-22: qty 60, 30d supply, fill #1
  Filled 2023-04-11: qty 60, 30d supply, fill #2

## 2023-02-24 NOTE — Patient Instructions (Addendum)
-   plan to start ocrevus infusion (check labs) - prednisone dose pack - continue gabapentin 300-600mg  at bedtime (for pain) - continue cyclobenzaprine 10mg  twice a day as needed - patient is applying for disability; not able to work currently
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
## 2023-02-24 NOTE — Addendum Note (Signed)
Addended by: Darleen Crocker on: 02/24/2023 02:49 PM   Modules accepted: Orders

## 2023-03-01 ENCOUNTER — Other Ambulatory Visit: Payer: Self-pay

## 2023-03-01 ENCOUNTER — Other Ambulatory Visit (HOSPITAL_COMMUNITY): Payer: Self-pay

## 2023-03-01 LAB — NEUROMYELITIS OPTICA AUTOAB, IGG: NMO IgG Autoantibodies: <1.5 U/mL (ref 0.0–3.0)

## 2023-03-01 LAB — VITAMIN D 25 HYDROXY (VIT D DEFICIENCY, FRACTURES): Vit D, 25-Hydroxy: 13.3 ng/mL — ABNORMAL LOW (ref 30.0–100.0)

## 2023-03-01 NOTE — Congregational Nurse Program (Signed)
  Dept: [phone]   Congregational Nurse Program Note  Date of Encounter: 03/01/2023  Clinic visit to follow-up from neurology visit on 02/24/2023.  Has diagnosis of multiple sclerosis, answered questions regarding medications and found he had not picked up Prednisone that was prescribed at time of the visit.  Reviewed all of medication changes and the specific instructions for Prednisone and possible side effects after picking up the medication from the pharmacy.  He is having difficulty vision both eyes, worse on left eye.  Confirmed time of next neurology appointment.  BP  114/75, pulse 88 and regular, O2 Sat 98%.  Discussed process for disability, Partnership case manager is assisting with the application process. Past Medical History: Past Medical History:  Diagnosis Date   Asthma    Back pain    Coordination impairment    Falls frequently    Impaired gait and mobility    Slow rate of speech    Stroke [facility])    2004   Vision blurred     Encounter Details:  CNP Questionnaire - 03/01/23 1400       Questionnaire   Ask client: Do you give verbal consent for me to treat you today? Yes    Student Assistance N/A    Location Patient [place]    Visit Setting with Client Organization    Patient Status Unknown   Has own apartment at [employer] Uninsured ([place] Card/Care Connects/Self-Pay/Medicaid Family Planning)    Insurance/Financial Assistance Referral N/A    Medication Have Medication Insecurities;Provided Medication Assistance    Medical Provider No    Screening Referrals Made N/A    Medical Referrals Made N/A    Medical Appointment Made N/A    Recently w/o PCP, now 1st time PCP visit completed due to CNs referral or appointment made N/A    Food Have Food Insecurities    Transportation Need transportation assistance    Housing/Utilities N/A    Interpersonal Safety N/A    Interventions Advocate/Support;Navigate Healthcare  System;Counsel;Educate;Case Management;Spiritual Care;Reviewed Medications;Reviewed New Diagnosis    Abnormal to Normal Screening Since Last CN Visit N/A    Screenings CN Performed Blood Pressure;Pulse Ox    Sent Client to Lab for: N/A    Did client attend any of the following based off CNs referral or appointments made? Medical    ED Visit Averted N/A    Life-Saving Intervention Made N/A

## 2023-03-03 ENCOUNTER — Telehealth: Payer: Self-pay

## 2023-03-03 NOTE — Telephone Encounter (Signed)
-----   Message from Penni Bombard, MD sent at 03/03/2023  1:13 PM EDT ----- Low Vit D. Start vitamin D supplement 2000 units daily.  -VRP

## 2023-03-03 NOTE — Telephone Encounter (Signed)
Contacted pt, informed him Low Vit D. Start vitamin D supplement 2000 units daily. He asked what the 3 medication are used for again that was prescribed in office. Went over Gabapentin, flexeril and prednisone which he will be finishing in a few days. All information was verbally understood, he appreciated the call.

## 2023-03-07 ENCOUNTER — Ambulatory Visit: Payer: Commercial Managed Care - HMO | Admitting: Diagnostic Neuroimaging

## 2023-03-22 ENCOUNTER — Other Ambulatory Visit (HOSPITAL_COMMUNITY): Payer: Self-pay

## 2023-03-22 NOTE — Congregational Nurse Program (Signed)
Dept: [phone]   Congregational Nurse Program Note  Date of Encounter: 03/22/2023  Past Medical History: Past Medical History:  Diagnosis Date   Asthma    Back pain    Coordination impairment    Falls frequently    Impaired gait and mobility    Slow rate of speech    Stroke [facility])    2004   Vision blurred     Encounter Details:  CNP Questionnaire - 03/22/23 1435       Questionnaire   Ask client: Do you give verbal consent for me to treat you today? Yes    Student Assistance N/A    Location Patient [facility]    Visit Setting with Client Organization    Patient Status Unknown   Has own apartment at [facility]    Insurance/Financial Assistance Referral N/A    Medication Have Medication Insecurities    Medical Provider No    Screening Referrals Made N/A    Medical Referrals Made N/A    Medical Appointment Made N/A    Recently w/o PCP, now 1st time PCP visit completed due to CNs referral or appointment made N/A    Food Have Food Insecurities    Transportation Need transportation assistance    Housing/Utilities N/A    Interpersonal Safety N/A    Interventions Advocate/Support;Navigate Healthcare System;Counsel;Educate;Case Management;Spiritual Care;Reviewed Medications    Abnormal to Normal Screening Since Last CN Visit N/A    Screenings CN Performed Blood Pressure;Pulse Ox;Weight    Sent Client to Lab for: N/A    Did client attend any of the following based off CNs referral or appointments made? N/A    ED Visit Averted N/A    Life-Saving Intervention Made N/A               Dept: [phone]   Congregational Nurse Program Note  Date of Encounter: 03/22/2023  Clinic visit to check blood pressure and for assistance with medication refill.  115/93, pulse 96 and regular, O2 Sat 98%.  Complaining of intermittent lower back pain that has improved with taking the gabapentin, also with taking Flexeril has less  spasms.  States he didn't see any improvement in his condition after completing the one week Prednisone prescription.  Called Dr. Marylou Flesher office and spoke with the nurse and gave her update on his symptoms for the MD to determine which medicines needed at this time. Nurse called [facility] to have both medicines refilled. Verified with pharmacy that prescriptions had been refilled. Spoke with Partnership case manager who will pick up medicines tomorrow, notified resident via email that medicines will be available tomorrow.  Past Medical History: Past Medical History:  Diagnosis Date   Asthma    Back pain    Coordination impairment    Falls frequently    Impaired gait and mobility    Slow rate of speech    Stroke [facility])    2004   Vision blurred     Encounter Details:  CNP Questionnaire - 03/22/23 1435       Questionnaire   Ask client: Do you give verbal consent for me to treat you today? Yes    Student Assistance N/A    Location Patient [facility]    Visit Setting with Client Organization    Patient Status Unknown   Has own apartment at [facility]    Insurance/Financial Assistance Referral N/A    Medication  Have Medication Insecurities    Medical Provider No    Screening Referrals Made N/A    Medical Referrals Made N/A    Medical Appointment Made N/A    Recently w/o PCP, now 1st time PCP visit completed due to CNs referral or appointment made N/A    Food Have Food Insecurities    Transportation Need transportation assistance    Housing/Utilities N/A    Interpersonal Safety N/A    Interventions Advocate/Support;Navigate Healthcare System;Counsel;Educate;Case Management;Spiritual Care;Reviewed Medications    Abnormal to Normal Screening Since Last CN Visit N/A    Screenings CN Performed Blood Pressure;Pulse Ox;Weight    Sent Client to Lab for: N/A    Did client attend any of the following based off CNs  referral or appointments made? N/A    ED Visit Averted N/A    Life-Saving Intervention Made N/A

## 2023-03-24 ENCOUNTER — Other Ambulatory Visit (HOSPITAL_COMMUNITY): Payer: Self-pay

## 2023-03-24 ENCOUNTER — Other Ambulatory Visit: Payer: Self-pay

## 2023-03-28 ENCOUNTER — Encounter: Payer: Self-pay | Admitting: Neurology

## 2023-03-28 ENCOUNTER — Telehealth: Payer: Self-pay | Admitting: Diagnostic Neuroimaging

## 2023-03-28 NOTE — Telephone Encounter (Signed)
Pt is asking for a letter stating he is seen here by Dr Marjory Lies for MS.  This will be used towards getting approval for SCAT

## 2023-03-28 NOTE — Telephone Encounter (Signed)
Letter will be completed.

## 2023-03-29 ENCOUNTER — Telehealth: Payer: Self-pay | Admitting: Neurology

## 2023-03-29 NOTE — Telephone Encounter (Signed)
Called the patient and there was no answer. Left a detailed message advising the patient that the letter is completed. Advised the pt to call back and let us know how he would like to go about getting the letter. Also I need his household size (how many ppl living with him) Annual household income (this is needed for financial assistance for the ocrevus medication)   **When the patient calls back please ask household size (how many ppl living with him) Annual household income (this is needed for financial assistance for the ocrevus medication) and also ask about how he would like for me to get the letter for him ready to him. I can place at front for him to pick up, fax, email or mail it

## 2023-03-29 NOTE — Telephone Encounter (Signed)
I have faxed the [facility] form and information to [employer] for the patient. I have given the IV infusion script as well as the information needed to [facility] for them to start the authorization process.

## 2023-03-30 NOTE — Telephone Encounter (Signed)
Letter placed in the mail for the patient as requested. Form has been updated and sent to [facility]

## 2023-03-30 NOTE — Telephone Encounter (Signed)
This response from [place], [place] was relayed to pt.

## 2023-03-30 NOTE — Telephone Encounter (Signed)
Pt has called back and the answer to the questions are:1. Pt lives alone 2. Annual household income-none, not on disability yet 3. He would like the letter mailed to him, [address] [place] [address]  has been confirmed as his address

## 2023-04-06 NOTE — Congregational Nurse Program (Signed)
  Dept: [phone]   Congregational Nurse Program Note  Date of Encounter: 04/05/2023  Clinic visit to discuss treatment for multiple sclerosis prescribed by the neurologist.  Resident received a package in mail explaining treatment with Ocrevus and a call from the drug company case manager.  Treatment to be infusion at MD office q 6 months beginning in July.  Reviewed information with him.  BP 142/82, pulse 96, O2 sat 99%,  complaining of back pain and was teary when discussing continued issues with falls.  Is scheduled to see a counselor later today for depression set-up by apartment case manager. Has not been cooking due to unsteady gait, referred to food pantry and made arrangements for I'm to receive a microwave oven to help prepare meals more quickly.   Past Medical History: Past Medical History:  Diagnosis Date   Asthma    Back pain    Coordination impairment    Falls frequently    Impaired gait and mobility    Slow rate of speech    Stroke [facility])    2004   Vision blurred     Encounter Details:  CNP Questionnaire - 04/05/23 1430       Questionnaire   Ask client: Do you give verbal consent for me to treat you today? Yes    Student Assistance N/A    Location Patient [facility]    Visit Setting with Client Organization    Patient Status Unknown   Has own apartment at [facility]    Insurance/Financial Assistance Referral N/A    Medication Have Medication Insecurities    Medical Provider No    Screening Referrals Made N/A    Medical Referrals Made N/A    Medical Appointment Made N/A    Recently w/o PCP, now 1st time PCP visit completed due to CNs referral or appointment made N/A    Food Have Food Insecurities;Referred to Food Pantry    Transportation Need transportation assistance    Housing/Utilities N/A    Interpersonal Safety N/A    Interventions Advocate/Support;Navigate Healthcare System;Counsel;Educate;Case  Management;Spiritual Care;Reviewed Medications    Abnormal to Normal Screening Since Last CN Visit N/A    Screenings CN Performed Blood Pressure;Pulse Ox    Sent Client to Lab for: N/A    Did client attend any of the following based off CNs referral or appointments made? N/A    ED Visit Averted Yes    Life-Saving Intervention Made N/A

## 2023-04-06 NOTE — Congregational Nurse Program (Signed)
  Dept: [phone]   Congregational Nurse Program Note  Date of Encounter: 03/08/2023  Clinic visit to discuss recent diagnosis of multiple sclerosis and made arrangements for medication refills.  [facility] case manager will pick up medications. Past Medical History: Past Medical History:  Diagnosis Date   Asthma    Back pain    Coordination impairment    Falls frequently    Impaired gait and mobility    Slow rate of speech    Stroke [facility])    2004   Vision blurred     Encounter Details:  CNP Questionnaire - 03/08/23 1555       Questionnaire   Ask client: Do you give verbal consent for me to treat you today? Yes    Student Assistance N/A    Location Patient [facility]    Visit Setting with Client Organization    Patient Status Unknown   Has own apartment at [facility]    Insurance/Financial Assistance Referral N/A    Medication Have Medication Insecurities    Medical Provider No    Screening Referrals Made N/A    Medical Referrals Made N/A    Medical Appointment Made N/A    Recently w/o PCP, now 1st time PCP visit completed due to CNs referral or appointment made N/A    Food Have Food Insecurities    Transportation Need transportation assistance    Housing/Utilities N/A    Interpersonal Safety N/A    Interventions Advocate/Support;Navigate Healthcare System;Counsel;Educate;Case Management;Spiritual Care;Reviewed Medications;Reviewed New Diagnosis    Abnormal to Normal Screening Since Last CN Visit N/A    Screenings CN Performed Blood Pressure;Pulse Ox    Sent Client to Lab for: N/A    Did client attend any of the following based off CNs referral or appointments made? Medical    ED Visit Averted N/A    Life-Saving Intervention Made N/A

## 2023-04-07 ENCOUNTER — Other Ambulatory Visit (HOSPITAL_COMMUNITY): Payer: Self-pay

## 2023-04-11 ENCOUNTER — Other Ambulatory Visit (HOSPITAL_COMMUNITY): Payer: Self-pay

## 2023-04-15 ENCOUNTER — Telehealth: Payer: Self-pay | Admitting: Diagnostic Neuroimaging

## 2023-04-15 NOTE — Telephone Encounter (Signed)
Pt called stating that he has been having bad back pain that goes all the way up to his neck. Pt states he feels pain when he moves his neck. This has been going on for over a month along with legs and feet swelling and his L side goes numb. Please advise.

## 2023-04-18 NOTE — Telephone Encounter (Signed)
Attempted to call the patient. There was no answer. Left a detailed message advising the patient that the symptoms described are coming from the MS and that we have been working on getting approval and authorization for the medication. Advised the patient that there is approval on free drug for him that came through. Infusion suite is waiting for this to upload on their end and once they have it, they will be calling to get him scheduled.

## 2023-04-18 NOTE — Telephone Encounter (Signed)
We received a notification that the patient was approved to receive ocrevus free of charge. The infusion suite should be reaching out to the patient about getting scheduled soon. These symptoms are related to MS and we need to get him started on therapy.

## 2023-04-19 MED ORDER — SODIUM CHLORIDE 0.9 % IV SOLN: INTRAVENOUS | 2 refills | Status: AC

## 2023-04-19 NOTE — Telephone Encounter (Signed)
RX for Ocrevus placed and will be faxed to [facility] per Infusion Suite's request.

## 2023-04-19 NOTE — Congregational Nurse Program (Signed)
  Dept: [phone]   Congregational Nurse Program Note  Date of Encounter: 04/19/2023  Clinic visit for assistance with finding a primary car physician.  Explained that with Medicaid he has been assigned to a MD practice.  Per his Medicaid care identified assigned provider and made call but office was closing.  BP 126/90, pulse 104, explained that these levels require MD attention. 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 Yes    Student Assistance N/A    Location Patient [facility]    Visit Setting with Client Organization    Patient Status Unknown   Has own apartment at [facility]    Insurance/Financial Assistance Referral N/A    Medication Have Medication Insecurities    Medical Provider No    Screening Referrals Made N/A    Medical Referrals Made N/A    Medical Appointment Made N/A    Recently w/o PCP, now 1st time PCP visit completed due to CNs referral or appointment made N/A    Food Have Food Insecurities    Transportation Need transportation assistance    Housing/Utilities N/A    Interpersonal Safety N/A    Interventions Advocate/Support;Navigate Healthcare System;Counsel;Educate;Case Management;Spiritual Care;Reviewed Medications    Abnormal to Normal Screening Since Last CN Visit N/A    Screenings CN Performed Blood Pressure;Pulse Ox    Sent Client to Lab for: N/A    Did client attend any of the following based off CNs referral or appointments made? N/A    ED Visit Averted N/A    Life-Saving Intervention Made N/A

## 2023-04-19 NOTE — Addendum Note (Signed)
Addended by: Geronimo Running A on: 04/19/2023 10:06 AM   Modules accepted: Orders

## 2023-04-19 NOTE — Telephone Encounter (Signed)
Ocrevus RX faxed to [facility]. Received a receipt of confirmation.

## 2023-04-20 NOTE — Congregational Nurse Program (Signed)
  Dept: [phone]   Congregational Nurse Program Note  Date of Encounter: 04/20/2023  Appointment made for resident to see NP at [facility] to be his PCP.  Notified [facility] [employer] coach and resident via email of the appointment. 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 Yes    Student Assistance N/A    Location Patient Served  N/A    Visit Setting with Client Phone/Text/Email    Patient Status Unknown   Has own apartment at [facility]    Insurance/Financial Assistance Referral N/A    Medication Have Medication Insecurities    Medical Provider Yes    Screening Referrals Made Annual Wellness Visit    Medical Referrals Made [facility] PCP/Clinic    Medical Appointment Made Non-[facility] PCP/clinic    Recently w/o PCP, now 1st time PCP visit completed due to CNs referral or appointment made N/A    Food Have Food Insecurities;Referred to Food [employer]    Transportation Need transportation assistance    Housing/Utilities N/A    Interpersonal Safety N/A    Interventions Advocate/Support;Navigate Healthcare System;Counsel;Case Management    Abnormal to Normal Screening Since Last CN Visit N/A    Screenings CN Performed N/A    Sent Client to Lab for: N/A    Did client attend any of the following based off CNs referral or appointments made? N/A    ED Visit Averted N/A    Life-Saving Intervention Made N/A

## 2023-04-21 NOTE — Telephone Encounter (Signed)
Approval received, per Infusion. Once drug is received, he will be contacted to schedule.

## 2023-04-26 NOTE — Congregational Nurse Program (Signed)
  Dept: [phone]   Congregational Nurse Program Note  Date of Encounter: 04/26/2023  Clinic visit to check blood pressure, BP 128/90, pulse 98 and regular, O2 Sat 99%.  Had initial visit at [facility] and will have annual physical on 05/06/2023.  States initial visit went well for establishing the practice as PCP.  Complains of continued back pain that is worse in evening.  Educated regarding strategies to relieve pain when lying down at night and for modifying activities during the day to decrease back pain, reminded to keep walker or can with him at all times.  Reports taking medication as prescribed by the neurologist.  Complains that vision is worsening, to start treatment for MS is July.  Explained that he should not have eye examination until after treatment started to determine if vision improves with the new medication. 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 Yes    Student Assistance N/A    Location Patient [facility]    Visit Setting with Client Organization    Patient Status Unknown   Has own apartment at [facility]    Insurance/Financial Assistance Referral N/A    Medication Have Medication Insecurities    Medical Provider Yes    Screening Referrals Made N/A    Medical Referrals Made N/A    Medical Appointment Made N/A    Recently w/o PCP, now 1st time PCP visit completed due to CNs referral or appointment made N/A    Food Have Food Insecurities    Transportation Need transportation assistance    Housing/Utilities N/A    Interpersonal Safety N/A    Interventions  Advocate/Support;Counsel;Case Management;Educate;Reviewed Medications    Abnormal to Normal Screening Since Last CN Visit N/A    Screenings CN Performed Blood Pressure;Pulse Ox    Sent Client to Lab for: N/A    Did client attend any of the following based off CNs referral or appointments made? N/A    ED Visit Averted N/A    Life-Saving Intervention Made N/A

## 2023-04-28 NOTE — Telephone Encounter (Signed)
Received notification from infusion suite that patient is scheduled for the first loading dose is 6/10 second 6/24 10am

## 2023-05-03 NOTE — Congregational Nurse Program (Signed)
  Dept: [phone]   Congregational Nurse Program Note  Date of Encounter: 05/03/2023  Clinic visit to check blood pressure, BP 120/80 with pulse 98 and regular. Continued complaint of weakness left side with inability to always hold items in his hands without dropping them.Has intermittent back and neck pain, reviewed medications, he is taking as prescribed by the neurologist.    Annual physical scheduled with new PCP 05/06/2023, to start IV medication at the neurologist office on June 10th.  [facility] case manager is arranging transportation for both appointments. 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 Yes    Student Assistance N/A    Location Patient [facility]    Visit Setting with Client Organization    Patient Status Unknown   Has own apartment at [facility]    Insurance/Financial Assistance Referral N/A    Medication Have Medication Insecurities    Medical Provider Yes    Screening Referrals Made N/A    Medical Referrals Made N/A    Medical Appointment Made N/A    Recently w/o PCP, now 1st time PCP visit completed due to CNs referral or appointment made N/A    Food Have Food Insecurities    Transportation Need transportation assistance    Housing/Utilities N/A    Interpersonal Safety N/A    Interventions Advocate/Support;Counsel;Case Management;Educate;Reviewed Medications    Abnormal to Normal Screening Since Last CN Visit N/A    Screenings CN Performed Blood Pressure;Pulse Ox    Sent Client to Lab for: N/A    Did client attend any of the following based off CNs referral or  appointments made? N/A    ED Visit Averted N/A    Life-Saving Intervention Made N/A

## 2023-05-06 ENCOUNTER — Other Ambulatory Visit (HOSPITAL_COMMUNITY): Payer: Self-pay

## 2023-05-06 MED ORDER — CYCLOBENZAPRINE HCL 10 MG PO TABS
10.0000 mg | ORAL_TABLET | Freq: Two times a day (BID) | ORAL | 3 refills | Status: DC | PRN
  Filled 2023-05-06: qty 60, 30d supply, fill #0
  Filled 2023-06-14: qty 60, 30d supply, fill #1

## 2023-05-06 MED ORDER — GABAPENTIN 300 MG PO CAPS
300.0000 mg | ORAL_CAPSULE | Freq: Three times a day (TID) | ORAL | 3 refills | Status: DC
  Filled 2023-05-06: qty 90, 30d supply, fill #0
  Filled 2023-06-14 (×2): qty 90, 30d supply, fill #1

## 2023-05-06 MED ORDER — TRAMADOL HCL 50 MG PO TABS
50.0000 mg | ORAL_TABLET | Freq: Four times a day (QID) | ORAL | 0 refills | Status: DC | PRN
  Filled 2023-05-06: qty 20, 5d supply, fill #0
  Filled 2023-05-06: qty 28, 7d supply, fill #0
  Filled 2023-06-14: qty 20, 5d supply, fill #1

## 2023-05-10 NOTE — Congregational Nurse Program (Signed)
  Dept: [phone]   Congregational Nurse Program Note  Date of Encounter: 05/10/2023  Clinic visit to check blood pressure and confirm appointment schedule with PCP.  BP 146/91, pulse 92 and regular.  Complains of back and leg pain and has intermittent falls from weakness and decreased mobility even with using the walker.  Continues to have decreased vision and has 4 CM abrasion inner aspect of right upper arm that is open but not draining, cleaned with alcohol pad.  Reviewed medications and educated regarding taking Tramadol up to 4 times in 24 hrs if needed.  Instructed to take medications in AM prior to going for first ocrelizumab infusion for the multiple sclerosis.  Is to take medications bottles with him to the infusion appointment. 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 Yes    Student Assistance N/A    Location Patient [facility]    Visit Setting with Client Organization    Patient Status Unknown   Has own apartment at [facility]    Insurance/Financial Assistance Referral N/A    Medication Have Medication Insecurities    Medical Provider Yes    Screening Referrals Made N/A    Medical Referrals Made N/A    Medical Appointment Made N/A    Recently w/o PCP, now 1st time PCP visit completed due to CNs referral or appointment made Yes    Food Have Food Insecurities    Transportation Need transportation assistance    Housing/Utilities N/A    Interpersonal Safety N/A    Interventions Advocate/Support;Counsel;Case Management;Educate;Reviewed Medications;Spiritual Care    Abnormal to Normal Screening  Since Last CN Visit N/A    Screenings CN Performed Blood Pressure;Pulse Ox    Sent Client to Lab for: N/A    Did client attend any of the following based off CNs referral or appointments made? Medical    ED Visit Averted N/A    Life-Saving Intervention Made N/A

## 2023-05-17 ENCOUNTER — Other Ambulatory Visit: Payer: Self-pay

## 2023-05-20 ENCOUNTER — Other Ambulatory Visit (HOSPITAL_COMMUNITY): Payer: Self-pay

## 2023-05-20 MED ORDER — TRAMADOL HCL 50 MG PO TABS
50.0000 mg | ORAL_TABLET | Freq: Four times a day (QID) | ORAL | 0 refills | Status: DC | PRN
  Filled 2023-05-20 – 2023-06-21 (×2): qty 60, 15d supply, fill #0

## 2023-05-24 ENCOUNTER — Other Ambulatory Visit (HOSPITAL_COMMUNITY): Payer: Self-pay

## 2023-05-24 MED ORDER — ESCITALOPRAM OXALATE 10 MG PO TABS
10.0000 mg | ORAL_TABLET | Freq: Every day | ORAL | 1 refills | Status: DC
  Filled 2023-05-24: qty 30, 30d supply, fill #0
  Filled 2023-06-14: qty 30, 30d supply, fill #1

## 2023-05-24 NOTE — Congregational Nurse Program (Signed)
  Dept: [phone]   Congregational Nurse Program Note  Date of Encounter: 05/17/2023  Clinic visit to follow-up after MD office visit for infusion of medication for multiple sclerosis. States he continues to have back pain, weakness especially on the left side and stumbles or falls in his apartment.  Has completed initial prescription of Tramadol ordered by PCP and has no further neurology visit until 05/26/2023. Spoke with nurse at PCP office, MD not available, she will notify MD of continued pain and contact resident when MD orders medication.  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 Yes    Student Assistance N/A    Location Patient [facility]    Visit Setting with Client Organization    Patient Status Unknown   Has own apartment at [facility]    Insurance/Financial Assistance Referral N/A    Medication Have Medication Insecurities    Medical Provider Yes    Screening Referrals Made N/A    Medical Referrals Made N/A    Medical Appointment Made N/A    Recently w/o PCP, now 1st time PCP visit completed due to CNs referral or appointment made Yes    Food Have Food Insecurities    Transportation Need transportation assistance    Housing/Utilities N/A    Interpersonal Safety N/A    Interventions Advocate/Support;Counsel;Case Management;Educate;Reviewed Medications;Spiritual Care    Abnormal to Normal Screening Since Last CN Visit N/A    Screenings CN Performed Blood Pressure;Pulse Ox    Sent Client to Lab for: N/A    Did client attend any of the following based off CNs referral or appointments made?  Medical    ED Visit Averted N/A    Life-Saving Intervention Made N/A

## 2023-05-25 ENCOUNTER — Other Ambulatory Visit (HOSPITAL_COMMUNITY): Payer: Self-pay

## 2023-05-31 ENCOUNTER — Other Ambulatory Visit (HOSPITAL_COMMUNITY): Payer: Self-pay

## 2023-06-07 NOTE — Congregational Nurse Program (Signed)
Dept: [phone]   Congregational Nurse Program Note  Date of Encounter: 05/24/2023      Dept: [phone]   Congregational Nurse Program Note  Date of Encounter: 05/24/2023  Clinic visit to confirm appointment for IV infusion at neurologist office on 05/26/2023.  States he has not had any side effects from the initial IV infusion.  [facility] CM is arranging transportation for the appointment.  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 Yes    Student Assistance N/A    Location Patient [facility]    Visit Setting with Client Organization    Patient Status Unknown   Has own apartment at [facility]    Insurance/Financial Assistance Referral N/A    Medication Have Medication Insecurities    Medical Provider Yes    Screening Referrals Made N/A    Medical Referrals Made N/A    Medical Appointment Made N/A    Recently w/o PCP, now 1st time PCP visit completed due to CNs referral or appointment made N/A    Food Have Food Insecurities;Referred to Food Pantry    Transportation Need transportation assistance    Housing/Utilities N/A    Interpersonal Safety N/A    Interventions Advocate/Support;Counsel;Spiritual Care    Abnormal to Normal Screening Since Last CN Visit N/A    Screenings CN Performed N/A    Sent Client to Lab for: N/A    Did client attend any of the following based off CNs referral or appointments made? 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 Yes    Student Assistance N/A    Location Patient [facility]    Visit Setting with Client Organization    Patient Status Unknown   Has own apartment at [facility]    Insurance/Financial Assistance Referral N/A    Medication Have Medication Insecurities    Medical Provider Yes    Screening Referrals Made N/A    Medical Referrals Made N/A    Medical Appointment Made N/A    Recently w/o PCP, now 1st time PCP visit completed due to CNs referral or appointment made N/A    Food Have Food Insecurities;Referred to Food Pantry    Transportation Need transportation assistance    Housing/Utilities N/A    Interpersonal Safety N/A    Interventions Advocate/Support;Counsel;Spiritual Care    Abnormal to  Normal Screening Since Last CN Visit N/A    Screenings CN Performed N/A    Sent Client to Lab for: N/A    Did client attend any of the following based off CNs referral or appointments made? Medical    ED Visit Averted N/A    Life-Saving Intervention Made N/A

## 2023-06-07 NOTE — Congregational Nurse Program (Signed)
  Dept: [phone]   Congregational Nurse Program Note  Date of Encounter: 06/07/2023  Clinic visit for complaint of left leg and back pain.  Reviewed medications and he has taken Gabapentin 2 times daily when he can take 3 per day.  Took Gabapentin while at Clinic and is to take it three times daily next two days until appointment on Thursday the 11th then notify MD if no improvement.  BP 121/90, pulse 98 and regular. 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 Yes    Student Assistance N/A    Location Patient [facility]    Visit Setting with Client Organization    Patient Status Unknown   Has own apartment at [facility]    Insurance/Financial Assistance Referral N/A    Medication Have Medication Insecurities    Medical Provider Yes    Screening Referrals Made N/A    Medical Referrals Made N/A    Medical Appointment Made N/A    Recently w/o PCP, now 1st time PCP visit completed due to CNs referral or appointment made N/A    Food Have Food Insecurities;Referred to Food Pantry    Transportation Need transportation assistance    Housing/Utilities N/A    Interpersonal Safety N/A    Interventions Advocate/Support;Counsel;Case Management;Educate;Spiritual Care;Reviewed Medications    Abnormal to Normal Screening Since Last CN Visit N/A    Screenings CN Performed Blood Pressure;Pulse Ox    Sent Client to Lab for: N/A    Did client attend any of the following based off CNs referral or appointments made? N/A    ED Visit Averted N/A    Life-Saving Intervention Made N/A

## 2023-06-07 NOTE — Congregational Nurse Program (Signed)
  Dept: [phone]   Congregational Nurse Program Note  Date of Encounter: 05/31/2023  Clinic visit to discuss follow-up from completion of initial IV infusion for MS.  Has pain in left leg, minimal edema left leg, no improvement in vision but skin is clear and expresses less depressive type thoughts. Has appointment for counseling on 7/11 and neurology visit on 7/22, dates and time for appointments given to [facility] CM to arrange transportation. 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 Yes    Student Assistance N/A    Location Patient [facility]    Visit Setting with Client Organization    Patient Status Unknown   Has own apartment at [facility]    Insurance/Financial Assistance Referral N/A    Medication Have Medication Insecurities    Medical Provider Yes    Screening Referrals Made N/A    Medical Referrals Made N/A    Medical Appointment Made N/A    Recently w/o PCP, now 1st time PCP visit completed due to CNs referral or appointment made N/A    Food Have Food Insecurities;Referred to Food Pantry    Transportation Need transportation assistance;Referred to transportation service    Housing/Utilities N/A    Interpersonal Safety N/A    Interventions Advocate/Support;Counsel;Case Management;Educate;Spiritual Care;Reviewed Medications    Abnormal to Normal Screening Since Last CN Visit N/A    Screenings CN Performed Blood Pressure;Pulse Ox;Weight    Sent Client to Lab for: N/A    Did client attend any of the following based off CNs referral or appointments made? N/A    ED Visit Averted  N/A    Life-Saving Intervention Made N/A

## 2023-06-14 ENCOUNTER — Other Ambulatory Visit (HOSPITAL_COMMUNITY): Payer: Self-pay

## 2023-06-14 ENCOUNTER — Telehealth: Payer: Self-pay | Admitting: Diagnostic Neuroimaging

## 2023-06-14 NOTE — Telephone Encounter (Signed)
Cheree Ditto called needing to speak to the MD or RN. She states that the pt has shown no improvement after his infusion. Pt is in a lot of pain stating his back hurts, L leg hurts and is still falling do to stability issues. Lynden Ang will be at [facility] till 530 today please call her due to pt not having a dependable phone.

## 2023-06-14 NOTE — Telephone Encounter (Signed)
Called the case manager Cathy back. Advised that pt received partial dose of the ocrevus on 6/12 and the remaining 300 mg on 6/27. Advised that he is scheduled 11/09/2023 for the full 600 mg dose. Informed that the therapy is meant to slow/stop progression of the disease and that some people may not notice or feel a big difference but know that it is working by the updated MRI's indicating no further lesions.  Confirmed that the patient was still taking gabapentin and flexeril as prescribed. Pt had ran out and they were unaware that refills remained on the flexeril. They will pick up and restart medication

## 2023-06-15 ENCOUNTER — Other Ambulatory Visit (HOSPITAL_COMMUNITY): Payer: Self-pay

## 2023-06-20 ENCOUNTER — Ambulatory Visit: Payer: Commercial Managed Care - HMO | Admitting: Diagnostic Neuroimaging

## 2023-06-20 NOTE — Telephone Encounter (Signed)
error 

## 2023-06-21 ENCOUNTER — Other Ambulatory Visit (HOSPITAL_COMMUNITY): Payer: Self-pay

## 2023-06-21 NOTE — Congregational Nurse Program (Signed)
  Dept: [phone]   Congregational Nurse Program Note  Date of Encounter: 06/14/2023  Clinic visit for complaint for back and left leg pain and difficulty ambulating from unsteadiness d/t the MS.  Had run out of medication at end of last week and was unsure what to take.  Called neurology office and spoke with nurse at 2:50P.  He was to continue on Gabapentin and Flexeril and had 4 refills left on the current prescription.    Refills were called in and medications picked up by [facility].  Took medicine to resident at 5:20P and reviewed dosages, medication action and potential side effects.  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 Yes    Student Assistance N/A    Location Patient [facility]    Visit Setting with Client Organization    Patient Status Unknown   Has own apartment at [facility]    Insurance/Financial Assistance Referral N/A    Medication Have Medication Insecurities    Medical Provider Yes    Screening Referrals Made N/A    Medical Referrals Made N/A    Medical Appointment Made N/A    Recently w/o PCP, now 1st time PCP visit completed due to CNs referral or appointment made N/A    Food Have Food Insecurities    Transportation Need transportation assistance    Housing/Utilities N/A    Interpersonal Safety N/A    Interventions Advocate/Support;Counsel;Case Management;Educate;Spiritual Care;Reviewed Medications    Abnormal to Normal Screening Since Last CN Visit N/A    Screenings CN Performed Blood Pressure;Pulse Ox    Sent Client to Lab for: N/A    Did client attend  any of the following based off CNs referral or appointments made? N/A    ED Visit Averted Yes    Life-Saving Intervention Made N/A

## 2023-06-21 NOTE — Congregational Nurse Program (Signed)
  Dept: [phone]   Congregational Nurse Program Note  Date of Encounter: 06/21/2023  Clinic visit to check blood pressure and to review medications.  Is having less muscle spasms and decreased unsteady gait since resuming taking the Flexeril and Gabapentin.  PRN pain medication was not refilled.  Called [facility] [facility] patient pharmacy and found that a prior authorization was needed from Medicaid to refill the Tramadol.  Called PCP office, per nurse it will be completed today.  Also, has complaint of decreased vision left eye, discussed MS symptoms as related to vision and need to determine if vision improves from receiving the IV medication.  He is to discuss vision issue with the neurologist on next visit in August. Past Medical History: Past Medical History:  Diagnosis Date   Asthma    Back pain    Coordination impairment    Falls frequently    Impaired gait and mobility    Slow rate of speech    Stroke [facility])    2004   Vision blurred     Encounter Details:  CNP Questionnaire - 06/21/23 1325       Questionnaire   Ask client: Do you give verbal consent for me to treat you today? Yes    Student Assistance N/A    Location Patient [place]    Visit Setting with Client Organization    Patient Status Unknown   Has own apartment at [facility]    Insurance/Financial Assistance Referral N/A    Medication Have Medication Insecurities;Provided Medication Assistance    Medical Provider Yes    Screening Referrals Made N/A    Medical Referrals Made N/A    Medical Appointment Made N/A    Recently w/o PCP, now 1st time PCP visit completed due to CNs referral or appointment made N/A    Food Have Food Insecurities    Transportation Need transportation assistance    Housing/Utilities N/A    Interpersonal Safety N/A    Interventions Advocate/Support;Case Management;Counsel;Educate;Spiritual Care;Reviewed Medications    Abnormal to Normal  Screening Since Last CN Visit N/A    Screenings CN Performed Blood Pressure    Sent Client to Lab for: N/A    Did client attend any of the following based off CNs referral or appointments made? N/A    ED Visit Averted N/A    Life-Saving Intervention Made N/A

## 2023-06-22 ENCOUNTER — Other Ambulatory Visit (HOSPITAL_COMMUNITY): Payer: Self-pay

## 2023-06-22 MED ORDER — TRAMADOL HCL 50 MG PO TABS
50.0000 mg | ORAL_TABLET | Freq: Four times a day (QID) | ORAL | 0 refills | Status: DC | PRN
  Filled 2023-06-22: qty 60, 15d supply, fill #0

## 2023-06-28 ENCOUNTER — Other Ambulatory Visit (HOSPITAL_COMMUNITY): Payer: Self-pay

## 2023-06-28 MED ORDER — HYDROCORTISONE 0.5 % EX OINT: TOPICAL_OINTMENT | Freq: Two times a day (BID) | CUTANEOUS | 1 refills | Status: AC

## 2023-06-28 MED ORDER — ESCITALOPRAM OXALATE 10 MG PO TABS
10.0000 mg | ORAL_TABLET | Freq: Every day | ORAL | 1 refills | Status: DC
  Filled 2023-06-28: qty 30, 30d supply, fill #0

## 2023-06-28 NOTE — Congregational Nurse Program (Signed)
Dept: [phone]   Congregational Nurse Program Note  Date of Encounter: 06/28/2023  Past Medical History: Past Medical History:  Diagnosis Date   Asthma    Back pain    Coordination impairment    Falls frequently    Impaired gait and mobility    Slow rate of speech    Stroke [facility])    2004   Vision blurred     Encounter Details:  CNP Questionnaire - 06/28/23 1348       Questionnaire   Ask client: Do you give verbal consent for me to treat you today? Yes    Student Assistance N/A    Location Patient [place]    Visit Setting with Client Organization    Patient Status Unknown   Has own apartment at [facility]    Insurance/Financial Assistance Referral N/A    Medication Have Medication Insecurities    Medical Provider Yes    Screening Referrals Made N/A    Medical Referrals Made N/A    Medical Appointment Made N/A    Recently w/o PCP, now 1st time PCP visit completed due to CNs referral or appointment made N/A    Food Have Food Insecurities    Transportation Need transportation assistance    Housing/Utilities N/A    Interpersonal Safety N/A    Interventions Advocate/Support;Counsel;Educate;Spiritual Care;Reviewed Medications;Case Management    Abnormal to Normal Screening Since Last CN Visit N/A    Screenings CN Performed N/A    Sent Client to Lab for: N/A    Did client attend any of the following based off CNs referral or appointments made? N/A    ED Visit Averted N/A    Life-Saving Intervention Made N/A               Dept: [phone]   Congregational Nurse Program Note  Date of Encounter: 06/28/2023  Clinic visit to review summary from PCP office visit due to his visual difficulty from the MS.  He has fallen twice since list clinic visit and PCP is referring to occupational therapy to determine any modifications to his apartment and care to help decrease falls. Assisted with completing  authorization for release of information to go to [employer] for disability determination. Past Medical History: Past Medical History:  Diagnosis Date   Asthma    Back pain    Coordination impairment    Falls frequently    Impaired gait and mobility    Slow rate of speech    Stroke [facility])    2004   Vision blurred     Encounter Details:  CNP Questionnaire - 06/28/23 1348       Questionnaire   Ask client: Do you give verbal consent for me to treat you today? Yes    Student Assistance N/A    Location Patient [place]    Visit Setting with Client Organization    Patient Status Unknown   Has own apartment at [facility]    Insurance/Financial Assistance Referral N/A    Medication Have Medication Insecurities    Medical Provider Yes    Screening Referrals Made N/A    Medical Referrals Made N/A    Medical Appointment Made N/A    Recently w/o PCP, now 1st time PCP visit completed due to CNs referral or appointment made N/A    Food Have Food Insecurities    Transportation Need transportation assistance    Housing/Utilities N/A  Interpersonal Safety N/A    Interventions Advocate/Support;Counsel;Educate;Spiritual Care;Reviewed Medications;Case Management    Abnormal to Normal Screening Since Last CN Visit N/A    Screenings CN Performed N/A    Sent Client to Lab for: N/A    Did client attend any of the following based off CNs referral or appointments made? N/A    ED Visit Averted N/A    Life-Saving Intervention Made N/A

## 2023-07-04 ENCOUNTER — Other Ambulatory Visit (HOSPITAL_COMMUNITY): Payer: Self-pay

## 2023-07-04 ENCOUNTER — Ambulatory Visit (INDEPENDENT_AMBULATORY_CARE_PROVIDER_SITE_OTHER): Payer: Medicaid Other | Admitting: Diagnostic Neuroimaging

## 2023-07-04 ENCOUNTER — Encounter: Payer: Self-pay | Admitting: Diagnostic Neuroimaging

## 2023-07-04 ENCOUNTER — Telehealth: Payer: Self-pay | Admitting: Diagnostic Neuroimaging

## 2023-07-04 VITALS — BP 128/86 | HR 109 | Ht 70.0 in | Wt 170.6 lb

## 2023-07-04 DIAGNOSIS — M79604 Pain in right leg: Secondary | ICD-10-CM

## 2023-07-04 DIAGNOSIS — M79605 Pain in left leg: Secondary | ICD-10-CM

## 2023-07-04 DIAGNOSIS — G35 Multiple sclerosis: Secondary | ICD-10-CM

## 2023-07-04 DIAGNOSIS — R21 Rash and other nonspecific skin eruption: Secondary | ICD-10-CM | POA: Diagnosis not present

## 2023-07-04 DIAGNOSIS — H538 Other visual disturbances: Secondary | ICD-10-CM | POA: Diagnosis not present

## 2023-07-04 MED ORDER — CYCLOBENZAPRINE HCL 10 MG PO TABS
10.0000 mg | ORAL_TABLET | Freq: Two times a day (BID) | ORAL | 3 refills | Status: DC | PRN
  Filled 2023-07-04 – 2023-07-13 (×2): qty 60, 30d supply, fill #0
  Filled 2023-08-09: qty 60, 30d supply, fill #1
  Filled 2024-01-24: qty 60, 30d supply, fill #2
  Filled 2024-03-06: qty 60, 30d supply, fill #3

## 2023-07-04 MED ORDER — DULOXETINE HCL 30 MG PO CPEP
30.0000 mg | ORAL_CAPSULE | Freq: Every day | ORAL | 12 refills | Status: DC
  Filled 2023-07-04: qty 30, 30d supply, fill #0
  Filled 2023-08-09: qty 30, 30d supply, fill #1
  Filled 2023-12-23 – 2023-12-27 (×2): qty 30, 30d supply, fill #2
  Filled 2024-01-12 – 2024-01-31 (×3): qty 30, 30d supply, fill #3
  Filled 2024-02-06 – 2024-02-29 (×2): qty 30, 30d supply, fill #4
  Filled 2024-03-06 – 2024-03-27 (×2): qty 30, 30d supply, fill #5
  Filled 2024-04-13 – 2024-04-26 (×2): qty 30, 30d supply, fill #6

## 2023-07-04 MED ORDER — GABAPENTIN 300 MG PO CAPS
300.0000 mg | ORAL_CAPSULE | Freq: Every day | ORAL | 6 refills | Status: AC
  Filled 2023-07-04 – 2023-07-13 (×2): qty 60, 30d supply, fill #0
  Filled 2023-08-09: qty 60, 30d supply, fill #1
  Filled 2024-01-12 – 2024-01-31 (×3): qty 60, 30d supply, fill #2

## 2023-07-04 NOTE — Patient Instructions (Signed)
RELAPSING-REMITTING MULTIPLE SCLEROSIS (symptoms since ~2022)--> BLURRED VISION (LEFT EYE), RIGHT INTRANUCLEAR OPHTHALMOPLEGIA, LEFT FACE / ARM / LEG WEAKNESS, LEFT HEMIPARETIC GAIT, LEFT ARM DYSMETRIA, SCANNING SPEECH, DYSARTHRIA  - CONTINUE ocrevus infusion (started July 2024; check labs q6 months) - continue gabapentin 300-600mg  at bedtime (for pain) - continue cyclobenzaprine 10mg  twice a day as needed - start duloxetine 30mg  daily for pain and depression - patient is applying for disability; not able to work currently   RASH (midsternum, bleeding) - follow up with PCP; refer to dermatology  BLURRED VISION / double vision - refer to ophthalmology; eval for corrective lenses
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
## 2023-07-04 NOTE — Telephone Encounter (Signed)
[facility] for Ophthalmology fax to [facility] Phone: [phone], Fax: [phone].

## 2023-07-06 DIAGNOSIS — H469 Unspecified optic neuritis: Secondary | ICD-10-CM | POA: Diagnosis not present

## 2023-07-06 DIAGNOSIS — H40013 Open angle with borderline findings, low risk, bilateral: Secondary | ICD-10-CM | POA: Diagnosis not present

## 2023-07-06 DIAGNOSIS — H538 Other visual disturbances: Secondary | ICD-10-CM | POA: Diagnosis not present

## 2023-07-08 ENCOUNTER — Other Ambulatory Visit (HOSPITAL_COMMUNITY): Payer: Self-pay | Admitting: Family Medicine

## 2023-07-08 DIAGNOSIS — R1310 Dysphagia, unspecified: Secondary | ICD-10-CM

## 2023-07-12 ENCOUNTER — Other Ambulatory Visit (HOSPITAL_COMMUNITY): Payer: Self-pay

## 2023-07-13 ENCOUNTER — Other Ambulatory Visit (HOSPITAL_COMMUNITY): Payer: Self-pay

## 2023-07-18 ENCOUNTER — Ambulatory Visit (HOSPITAL_COMMUNITY)
Admission: RE | Admit: 2023-07-18 | Discharge: 2023-07-18 | Disposition: A | Discharge status: Home / Self Care | Payer: Medicaid Other | Source: Ambulatory Visit | Attending: Family Medicine | Admitting: Family Medicine

## 2023-07-18 ENCOUNTER — Encounter (HOSPITAL_COMMUNITY): Payer: Self-pay

## 2023-07-18 DIAGNOSIS — R1310 Dysphagia, unspecified: Secondary | ICD-10-CM | POA: Diagnosis not present

## 2023-07-26 NOTE — Congregational Nurse Program (Signed)
  Dept: [phone]   Congregational Nurse Program Note  Date of Encounter: 07/26/2023  Clinic visit to check blood pressure and follow-up from receiving reading glasses.  States he is able to read all hus mail since receiving the glasses with higher level maginfication.  Discussed recent falls in his apartment and possible ways to decrease falls when rushing to get to the bathroom.  He agreed to drink less water close to bedtime. Past Medical History: Past Medical History:  Diagnosis Date   Asthma    Back pain    Coordination impairment    Falls frequently    Impaired gait and mobility    Slow rate of speech    Stroke [facility])    2004   Vision blurred     Encounter Details:  CNP Questionnaire - 07/26/23 1410       Questionnaire   Ask client: Do you give verbal consent for me to treat you today? Yes    Student Assistance N/A    Location Patient [place]    Visit Setting with Client Organization    Patient Status Unknown   Has own apartment at [facility]    Insurance/Financial Assistance Referral N/A    Medication Have Medication Insecurities    Medical Provider Yes    Screening Referrals Made N/A    Medical Referrals Made Vision    Medical Appointment Made N/A    Recently w/o PCP, now 1st time PCP visit completed due to CNs referral or appointment made N/A    Food Have Food Insecurities    Transportation Need transportation assistance    Housing/Utilities N/A    Interpersonal Safety N/A    Interventions Advocate/Support;Counsel;Educate;Spiritual Care;Reviewed Medications;Case Management    Abnormal to Normal Screening Since Last CN Visit N/A    Screenings CN Performed N/A    Sent Client to Lab for: N/A    Did client attend any of the following based off CNs referral or appointments made? N/A    ED Visit Averted N/A    Life-Saving Intervention Made N/A

## 2023-08-02 ENCOUNTER — Other Ambulatory Visit (HOSPITAL_COMMUNITY): Payer: Self-pay

## 2023-08-02 NOTE — Congregational Nurse Program (Signed)
  Dept: [phone]   Congregational Nurse Program Note  Date of Encounter: 08/02/2023  Clinic visit to check blood pressure and obtain referral for visual difficulties that are from changes d/t the multiple sclerosis diagnosis.  Made appointment with [facility] for 09/20/23.  Spoke with PCP office regarding his falling when taking a shower last week and yesterday.  PCP had made referral for physical therapy that was not done because therapist was unable to contact him.  Gave PCP office name and telephone contact for the [facility] case manager to arrange for therapy visit. Past Medical History: Past Medical History:  Diagnosis Date   Asthma    Back pain    Coordination impairment    Falls frequently    Impaired gait and mobility    Slow rate of speech    Stroke [facility])    2004   Vision blurred     Encounter Details:  CNP Questionnaire - 08/02/23 1350       Questionnaire   Ask client: Do you give verbal consent for me to treat you today? Yes    Student Assistance N/A    Location Patient [place]    Visit Setting with Client Organization    Patient Status Unknown   Has own apartment at [facility]    Insurance/Financial Assistance Referral N/A    Medication Have Medication Insecurities    Medical Provider Yes    Screening Referrals Made N/A    Medical Referrals Made Vision    Medical Appointment Made Vision    Recently w/o PCP, now 1st time PCP visit completed due to CNs referral or appointment made N/A    Food Have Food Insecurities    Transportation Need transportation assistance    Housing/Utilities N/A    Interpersonal Safety N/A    Interventions Advocate/Support;Counsel;Educate;Spiritual Care;Reviewed Medications;Case Management    Abnormal to Normal Screening Since Last CN Visit N/A    Screenings CN Performed N/A    Sent Client to Lab for: N/A    Did client attend any of the following based off CNs  referral or appointments made? N/A    ED Visit Averted N/A    Life-Saving Intervention Made N/A

## 2023-08-09 ENCOUNTER — Other Ambulatory Visit (HOSPITAL_COMMUNITY): Payer: Self-pay

## 2023-08-10 ENCOUNTER — Emergency Department (HOSPITAL_COMMUNITY): Payer: Medicaid Other

## 2023-08-10 ENCOUNTER — Other Ambulatory Visit (HOSPITAL_COMMUNITY): Payer: Self-pay

## 2023-08-10 ENCOUNTER — Other Ambulatory Visit: Payer: Self-pay

## 2023-08-10 ENCOUNTER — Encounter (HOSPITAL_COMMUNITY): Payer: Self-pay | Admitting: Emergency Medicine

## 2023-08-10 ENCOUNTER — Emergency Department (HOSPITAL_COMMUNITY)
Admission: EM | Admit: 2023-08-10 | Discharge: 2023-08-10 | Disposition: A | Discharge status: Home / Self Care | Payer: Medicaid Other | Attending: Emergency Medicine | Admitting: Emergency Medicine

## 2023-08-10 DIAGNOSIS — R079 Chest pain, unspecified: Secondary | ICD-10-CM | POA: Diagnosis not present

## 2023-08-10 DIAGNOSIS — K047 Periapical abscess without sinus: Secondary | ICD-10-CM | POA: Insufficient documentation

## 2023-08-10 DIAGNOSIS — K0889 Other specified disorders of teeth and supporting structures: Secondary | ICD-10-CM | POA: Diagnosis present

## 2023-08-10 LAB — CBC
HCT: 42.4 % (ref 39.0–52.0)
Hemoglobin: 14.1 g/dL (ref 13.0–17.0)
MCH: 30.5 pg (ref 26.0–34.0)
MCHC: 33.3 g/dL (ref 30.0–36.0)
MCV: 91.6 fL (ref 80.0–100.0)
Platelets: 261 10*3/uL (ref 150–400)
RBC: 4.63 MIL/uL (ref 4.22–5.81)
RDW: 12.8 % (ref 11.5–15.5)
WBC: 7 10*3/uL (ref 4.0–10.5)
nRBC: 0 % (ref 0.0–0.2)

## 2023-08-10 LAB — TROPONIN I (HIGH SENSITIVITY)
Troponin I (High Sensitivity): 3 ng/L (ref <18)
Troponin I (High Sensitivity): 4 ng/L (ref <18)

## 2023-08-10 LAB — BASIC METABOLIC PANEL
Anion gap: 7 (ref 5–15)
BUN: 11 mg/dL (ref 6–20)
CO2: 28 mmol/L (ref 22–32)
Calcium: 9.5 mg/dL (ref 8.9–10.3)
Chloride: 103 mmol/L (ref 98–111)
Creatinine, Ser: 1.27 mg/dL — ABNORMAL HIGH (ref 0.61–1.24)
GFR, Estimated: >60 mL/min (ref >60)
Glucose, Bld: 90 mg/dL (ref 70–99)
Potassium: 3.9 mmol/L (ref 3.5–5.1)
Sodium: 138 mmol/L (ref 135–145)

## 2023-08-10 MED ORDER — IOHEXOL 350 MG/ML SOLN
75.0000 mL | Freq: Once | INTRAVENOUS | Status: AC | PRN
  Administered 2023-08-10: 75 mL via INTRAVENOUS

## 2023-08-10 MED ORDER — ACETAMINOPHEN 500 MG PO TABS
1000.0000 mg | ORAL_TABLET | Freq: Once | ORAL | Status: AC
  Administered 2023-08-10: 1000 mg via ORAL
  Filled 2023-08-10: qty 2

## 2023-08-10 NOTE — ED Triage Notes (Signed)
Per [facility] pt coming from home c/o right sided dental pain x 3 days and chest pain with dizziness x 2 days. 18G RAC. No aspirin given due to painful chewing. Hx of stroke. Ambulates with walker.

## 2023-08-10 NOTE — ED Provider Notes (Signed)
[place] EMERGENCY DEPARTMENT AT [facility] Provider Note   CSN: [id number] Arrival date & time: 08/10/23  1637     History  Chief Complaint  Patient presents with   Dental Pain   Chest Pain    Martin Barrett is a 42 y.o. male.  Is a 42 year old male is here today for bilateral mandibular dental pain, as well as chest pain.  Says has been ongoing for a few days.  He is worried about his wisdom teeth.   Dental Pain Chest Pain      Home Medications Prior to Admission medications   Medication Sig Start Date End Date Taking? Authorizing Provider  cyclobenzaprine (FLEXERIL) 10 MG tablet Take 1 tablet (10 mg total) by mouth 2 (two) times daily as needed. 07/04/23   Penumalli, Glenford Bayley, MD  DULoxetine (CYMBALTA) 30 MG capsule Take 1 capsule (30 mg total) by mouth daily. 07/04/23   Penumalli, Glenford Bayley, MD  gabapentin (NEURONTIN) 300 MG capsule Take 1-2 capsules (300-600 mg total) by mouth at bedtime. 07/04/23   Penumalli, Glenford Bayley, MD  hydrocortisone ointment 0.5 % Apply topically 2 (two) times daily. 06/28/23     ocrelizumab in sodium chloride 0.9 % 500 mL Inject 300mg  into vein on day 1 & 15. Inject 600mg  into vein every 6 months thereafter. 04/19/23   Penumalli, Glenford Bayley, MD      Allergies    Other and Strawberry (diagnostic)    Review of Systems   Review of Systems  Cardiovascular:  Positive for chest pain.    Physical Exam Updated Vital Signs BP (!) 146/81 (BP Location: Left Arm)   Pulse 96   Temp 98 F (36.7 C) (Oral)   Resp 16   Ht 5\' 10"  (1.778 m)   Wt 77.1 kg   SpO2 100%   BMI 24.39 kg/m  Physical Exam Vitals reviewed.  HENT:     Head:     Comments: No observable dental abscess.  No pooling of secretions, no floor of mouth swelling. Eyes:     Pupils: Pupils are equal, round, and reactive to light.  Cardiovascular:     Rate and Rhythm: Normal rate.     Heart sounds: Normal heart sounds. No murmur heard. Pulmonary:     Effort: Pulmonary effort  is normal.  Musculoskeletal:     Cervical back: Normal range of motion and neck supple.  Neurological:     Mental Status: He is alert.     ED Results / Procedures / Treatments   Labs (all labs ordered are listed, but only abnormal results are displayed) Labs Reviewed  BASIC METABOLIC PANEL - Abnormal; Notable for the following components:      Result Value   Creatinine, Ser 1.27 (*)    All other components within normal limits  CBC  TROPONIN I (HIGH SENSITIVITY)  TROPONIN I (HIGH SENSITIVITY)    EKG None  Radiology CT Maxillofacial W Contrast  Result Date: 08/10/2023 CLINICAL DATA:  Sublingual/submandibular abscess. Right-sided dental pain. EXAM: CT MAXILLOFACIAL WITH CONTRAST TECHNIQUE: Multidetector CT imaging of the maxillofacial structures was performed with intravenous contrast. Multiplanar CT image reconstructions were also generated. RADIATION DOSE REDUCTION: This exam was performed according to the departmental dose-optimization program which includes automated exposure control, adjustment of the mA and/or kV according to patient size and/or use of iterative reconstruction technique. CONTRAST:  75mL OMNIPAQUE IOHEXOL 350 MG/ML SOLN COMPARISON:  None Available. 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 No  fracture or mandibular dislocation. Orbits: Negative. No traumatic or inflammatory finding. Sinuses: Clear. Soft tissues: There is no abscess or fluid collection. There are few small reactive lymph nodes in the submandibular space, greater on the right Limited intracranial: No significant or unexpected finding. IMPRESSION: 1. No abscess or fluid collection. 2. 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 Electronically Signed   By: Deatra Robinson M.D.   On: 08/10/2023 21:56   DG Chest 2 View  Result Date: 08/10/2023 CLINICAL DATA:  Chest pain EXAM: CHEST - 2 VIEW COMPARISON:  04/28/2014 FINDINGS: Cardiac and mediastinal  contours are within normal limits. No focal pulmonary opacity. No pleural effusion or pneumothorax. No acute osseous abnormality. IMPRESSION: No acute cardiopulmonary process. Electronically Signed   By: Wiliam Ke M.D.   On: 08/10/2023 18:25    Procedures Procedures    Medications Ordered in ED Medications  iohexol (OMNIPAQUE) 350 MG/ML injection 75 mL (75 mLs Intravenous Contrast Given 08/10/23 2043)  acetaminophen (TYLENOL) tablet 1,000 mg (1,000 mg Oral Given 08/10/23 2147)    ED Course/ Medical Decision Making/ A&P                                 Medical Decision Making 42 year old male here today for dental pain, chest pain.  Differential diagnoses include dental caries, less likely dental abscess, ACS, chest wall pain.  Plan-regarding the patient's chest pain, initial EKG not ischemic per my independent review.  My dependent review the patient's chest x-ray does not show any pneumonia.  Patient's initial troponin 4.  Did obtain CT imaging of the patient's face which not show any abscess.  I went back to inform the patient of these results, however he had eloped.  I wish him the best.  Amount and/or Complexity of Data Reviewed Labs: ordered. Radiology: ordered.  Risk OTC drugs. Prescription drug management.           Final Clinical Impression(s) / ED Diagnoses Final diagnoses:  Dental infection    Rx / DC Orders ED Discharge Orders     None         Arletha Pili, DO 08/10/23 2242

## 2023-08-10 NOTE — Discharge Instructions (Signed)
You have dental cavities, need to follow-up with a dentist.

## 2023-08-12 NOTE — Congregational Nurse Program (Signed)
  Dept: [phone]   Congregational Nurse Program Note  Date of Encounter: 08/09/2023  Clinic visit to check blood pressure and for assistance with refilling medications.  BP 130/80, pulse 90 and regular.  Reviewed medications and called in refills to pharmacy, arranged for [facility] CM to pick up medications on Wednesday.  Is complaining of pain from what he describes as wisdom teeth that caused toothache in past, left messages for two dental offices to find appointment for him. 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 Yes    Student Assistance N/A    Location [place]    Visit Setting with Client Organization    Patient Status Unknown   Has own apartment at [facility]    Insurance/Financial Assistance Referral N/A    Medication Provided Medication Assistance;Have Medication Insecurities    Medical Provider Yes    Screening Referrals Made N/A    Medical Referrals Made Dental    Medical Appointment Made Dental    Recently w/o PCP, now 1st time PCP visit completed due to CNs referral or appointment made N/A    Food Have Food Insecurities    Transportation Need transportation assistance    Housing/Utilities N/A    Interpersonal Safety N/A    Interventions Advocate/Support;Counsel;Educate;Spiritual Care;Reviewed Medications;Case Management    Abnormal to Normal Screening Since Last CN Visit N/A    Screenings CN Performed N/A    Sent Client to Lab for: N/A    Did client attend any of the following based off CNs referral or appointments made? N/A    ED Visit Averted N/A     Life-Saving Intervention Made N/A

## 2023-08-16 NOTE — Congregational Nurse Program (Signed)
  Dept: [phone]   Congregational Nurse Program Note  Date of Encounter: 08/16/2023  Clinic visit due to continued dental pain, bilateral mandibular pain and pain both knees and left leg. Went to ER on 08/10/2023 when dental pain was worse, given acetaminophen 1000 mg and dental referral. Is less unsteady today and reports no falls since last visit, using walker to ambulate.  BP 130/83, pulse 94 and regular, O2 Sat 99%.  Appointment made with [facility] to manage treatment of the wisdom teeth.  Will need additional dentist for regular dental services. 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 Yes    Student Assistance N/A    Location Patient [place]    Visit Setting with Client Organization    Patient Status Unknown   Has own apartment at [facility]    Insurance/Financial Assistance Referral N/A    Medication Have Medication Insecurities    Medical Provider Yes    Screening Referrals Made N/A    Medical Referrals Made Dental    Medical Appointment Made Dental    Recently w/o PCP, now 1st time PCP visit completed due to CNs referral or appointment made N/A    Food Have Food Insecurities    Transportation Need transportation assistance;Provided transportation assistance    Housing/Utilities N/A    Interpersonal Safety N/A    Interventions Advocate/Support;Counsel;Spiritual Care;Reviewed Medications;Case Management    Abnormal to Normal Screening Since Last CN Visit N/A    Screenings CN Performed Blood Pressure;Pulse Ox    Sent Client to Lab for: N/A    Did client attend any of the following  based off CNs referral or appointments made? N/A    ED Visit Averted N/A    Life-Saving Intervention Made N/A

## 2023-08-18 ENCOUNTER — Other Ambulatory Visit (HOSPITAL_COMMUNITY): Payer: Self-pay

## 2023-08-18 MED ORDER — CHLORHEXIDINE GLUCONATE 0.12 % MT SOLN
15.0000 mL | Freq: Two times a day (BID) | OROMUCOSAL | 0 refills | Status: DC
  Filled 2023-08-18: qty 473, 16d supply, fill #0

## 2023-08-18 MED ORDER — AMOXICILLIN 500 MG PO CAPS
500.0000 mg | ORAL_CAPSULE | Freq: Three times a day (TID) | ORAL | 0 refills | Status: DC
  Filled 2023-08-18: qty 21, 7d supply, fill #0

## 2023-08-18 MED ORDER — IBUPROFEN 800 MG PO TABS
800.0000 mg | ORAL_TABLET | Freq: Three times a day (TID) | ORAL | 0 refills | Status: AC | PRN
  Filled 2023-08-18: qty 20, 10d supply, fill #0

## 2023-08-23 ENCOUNTER — Other Ambulatory Visit (HOSPITAL_COMMUNITY): Payer: Self-pay

## 2023-08-24 ENCOUNTER — Telehealth: Payer: Self-pay | Admitting: Diagnostic Neuroimaging

## 2023-08-24 NOTE — Telephone Encounter (Signed)
Pt has called to report he is working with the law [employer] and [employer] firm for his applying for disability. They have informed him they are waiting to speak with Dr Marjory Lies or staff for information in the application process, pt is asking if RN can call  the attorneys office at [phone]

## 2023-08-29 NOTE — Telephone Encounter (Signed)
Pt called and the response from [place], [place] was relayed.

## 2023-08-30 NOTE — Congregational Nurse Program (Signed)
  Dept: [phone]   Congregational Nurse Program Note  Date of Encounter: 08/23/2023  Clinic visit to follow-up from dental appointment on the 20th.  Had all 4 wisdom teeth removed but did not get the post procedure prescriptions picked-up.  Spoke with complex ase Production designer, theatre/television/film and he went to pick up oral antibiotic and antibiotic mouth rinse, educated resident on proper oral post-procedure care.   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 Yes    Student Assistance N/A    Location Patient [place]    Visit Setting with Client Organization    Patient Status Unknown    Insurance Medicaid    Insurance/Financial Assistance Referral N/A    Medication Have Medication Insecurities;Provided Medication Assistance    Medical Provider Yes    Screening Referrals Made N/A    Medical Referrals Made N/A    Medical Appointment Made N/A    Recently w/o PCP, now 1st time PCP visit completed due to CNs referral or appointment made N/A    Food Have Food Insecurities    Transportation Need transportation assistance    Housing/Utilities N/A    Interpersonal Safety N/A    Interventions Advocate/Support;Counsel;Spiritual Care;Reviewed Medications    Abnormal to Normal Screening Since Last CN Visit N/A    Screenings CN Performed Blood Pressure;Pulse Ox    Sent Client to Lab for: N/A    Did client attend any of the following based off CNs referral or appointments made? Medical    ED Visit Averted N/A    Life-Saving Intervention Made N/A

## 2023-08-30 NOTE — Congregational Nurse Program (Signed)
  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 Yes    Student Assistance N/A    Location [place]    Visit Setting with Client Organization    Patient Status Unknown   Has own apartment at [facility]    Insurance/Financial Assistance Referral N/A    Medication Have Medication Insecurities    Medical Provider Yes    Screening Referrals Made N/A    Medical Referrals Made N/A    Medical Appointment Made N/A    Recently w/o PCP, now 1st time PCP visit completed due to CNs referral or appointment made N/A    Food Have Food Insecurities    Transportation Need transportation assistance    Housing/Utilities N/A    Interpersonal Safety N/A    Interventions Advocate/Support;Counsel;Spiritual Care;Reviewed Medications;Case Management    Abnormal to Normal Screening Since Last CN Visit N/A    Screenings CN Performed Blood Pressure;Pulse Ox    Sent Client to Lab for: N/A    Did client attend any of the following based off CNs referral or appointments made? N/A    ED Visit Averted N/A    Life-Saving Intervention Made N/A

## 2023-09-01 ENCOUNTER — Other Ambulatory Visit (HOSPITAL_COMMUNITY): Payer: Self-pay

## 2023-09-01 MED ORDER — DULOXETINE HCL 30 MG PO CPEP
30.0000 mg | ORAL_CAPSULE | Freq: Every day | ORAL | 0 refills | Status: DC
  Filled 2023-09-01: qty 90, 90d supply, fill #0

## 2023-09-01 MED ORDER — CYCLOBENZAPRINE HCL 10 MG PO TABS
10.0000 mg | ORAL_TABLET | Freq: Two times a day (BID) | ORAL | 3 refills | Status: DC | PRN
  Filled 2023-09-01: qty 60, 30d supply, fill #0
  Filled 2023-09-22 – 2023-09-26 (×2): qty 60, 30d supply, fill #1
  Filled 2023-11-01: qty 60, 30d supply, fill #2
  Filled 2023-12-02: qty 60, 30d supply, fill #3

## 2023-09-01 MED ORDER — AMLODIPINE BESYLATE 5 MG PO TABS
5.0000 mg | ORAL_TABLET | Freq: Every day | ORAL | 0 refills | Status: DC
  Filled 2023-09-01: qty 30, 30d supply, fill #0

## 2023-09-01 MED ORDER — GABAPENTIN 300 MG PO CAPS
300.0000 mg | ORAL_CAPSULE | Freq: Three times a day (TID) | ORAL | 3 refills | Status: DC
Start: 2023-09-01 — End: 2024-02-02
  Filled 2023-09-01: qty 90, 30d supply, fill #0
  Filled 2023-11-01: qty 90, 30d supply, fill #1
  Filled 2023-12-02: qty 90, 30d supply, fill #2
  Filled 2023-12-23 – 2023-12-27 (×2): qty 90, 30d supply, fill #3

## 2023-09-02 ENCOUNTER — Other Ambulatory Visit (HOSPITAL_COMMUNITY): Payer: Self-pay

## 2023-09-20 NOTE — Congregational Nurse Program (Signed)
  Dept: [phone]   Congregational Nurse Program Note  Date of Encounter: 09/20/2023  Clinic visit to check blood pressure, has taken Amlodipine 5mg  twice daily instead of once daily as prescribed.  Left message for the nurse at the [facility] to notify them of the mistake in taking the medication.  BP 111/77, pulse 88 and regular, denies dizziness, nausea, chest pain, headache. 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 Yes    Student Assistance N/A    Location [place]    Encounter Setting CN site    Population Status Unknown   Has own apartment at [facility]    Insurance/Financial Assistance Referral N/A    Medication Have Medication Insecurities    Medical Provider Yes    Screening Referrals Made N/A    Medical Referrals Made N/A    Medical Appointment Completed N/A    CNP Interventions Advocate/Support;Counsel;Spiritual Care;Reviewed Medications;Case Management    Screenings CN Performed Blood Pressure    ED Visit Averted N/A    Life-Saving Intervention Made N/A      Questionnaire   Housing/Utilities N/A

## 2023-09-22 ENCOUNTER — Other Ambulatory Visit (HOSPITAL_COMMUNITY): Payer: Self-pay

## 2023-09-23 ENCOUNTER — Other Ambulatory Visit (HOSPITAL_COMMUNITY): Payer: Self-pay

## 2023-09-23 MED ORDER — AMLODIPINE BESYLATE 5 MG PO TABS
5.0000 mg | ORAL_TABLET | Freq: Every day | ORAL | 0 refills | Status: DC
  Filled 2023-09-23: qty 30, 30d supply, fill #0

## 2023-09-26 ENCOUNTER — Other Ambulatory Visit: Payer: Self-pay

## 2023-09-27 ENCOUNTER — Other Ambulatory Visit (HOSPITAL_COMMUNITY): Payer: Self-pay

## 2023-09-27 MED ORDER — AMLODIPINE BESYLATE 5 MG PO TABS
5.0000 mg | ORAL_TABLET | Freq: Every day | ORAL | 0 refills | Status: DC
  Filled 2023-09-27: qty 90, 90d supply, fill #0

## 2023-09-27 MED ORDER — ESCITALOPRAM OXALATE 10 MG PO TABS
10.0000 mg | ORAL_TABLET | Freq: Every day | ORAL | 0 refills | Status: DC
  Filled 2023-09-27: qty 24, 24d supply, fill #0
  Filled 2023-09-27: qty 66, 66d supply, fill #0

## 2023-09-27 NOTE — Congregational Nurse Program (Signed)
  Dept: [phone]   Congregational Nurse Program Note  Date of Encounter: 09/27/2023  Clinic visit to check blood pressure and discuss medications.  Last visit noted that resident had taken 10 mg Amlodipine daily instead 5 mg as prescribed.  Telephone call to [facility] with resident and spoke to Ms. Janee Morn, PA-C. Reported the BP 125/86 off Amlodipine 4 days after taking extra dosage first 15 days after prescription filled.  She will call in Amlodipine refill today, made appointment for resident to see her on Friday as other medications were not taken correctly due to his visual los from the multiple sclerosis.  Will get a pillbox this week to decrease more medication over dosage issues. 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 Yes    Student Assistance N/A    Location Patient [place]    Encounter Setting CN site    Population Status Unknown   Has own apartment at [facility]    Insurance/Financial Assistance Referral N/A    Medication Have Medication Insecurities;Provided Medication Assistance;Made Appointment for Client    Medical Provider Yes    Screening Referrals Made N/A    Medical Referrals Made [facility] PCP/Clinic    Medical Appointment Completed Non-[facility] PCP/Clinic    CNP Interventions Advocate/Support;Counsel;Spiritual Care;Reviewed Medications;Case Management;Educate    Screenings CN Performed Blood Pressure    ED Visit Averted Yes    Life-Saving Intervention Made N/A      Questionnaire   Housing/Utilities N/A

## 2023-09-28 ENCOUNTER — Other Ambulatory Visit (HOSPITAL_COMMUNITY): Payer: Self-pay

## 2023-09-30 ENCOUNTER — Other Ambulatory Visit (HOSPITAL_COMMUNITY): Payer: Self-pay

## 2023-09-30 MED ORDER — TRAMADOL HCL 50 MG PO TABS
50.0000 mg | ORAL_TABLET | Freq: Four times a day (QID) | ORAL | 0 refills | Status: DC | PRN
Start: 2023-09-30 — End: 2024-04-18
  Filled 2023-09-30 – 2024-03-06 (×2): qty 20, 5d supply, fill #0
  Filled 2024-03-27: qty 20, 5d supply, fill #1

## 2023-10-06 ENCOUNTER — Telehealth: Payer: Self-pay | Admitting: *Deleted

## 2023-10-06 NOTE — Telephone Encounter (Signed)
Lvm not able to reach pt

## 2023-10-11 NOTE — Congregational Nurse Program (Signed)
  Dept: [phone]   Congregational Nurse Program Note  Date of Encounter: 10/11/2023  Clinic visit for complaint of back pain and muscle weakness from the multiple sclerosis.  Has not been contacted by physical therapy from 10/01/23 referral.  Spoke with Cordelia Pen at [facility] who will resend the referral.  BP 121/74, heart rate 93 and regular.  Using pill organizer box and is taking medications as prescribed.   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 Yes    Student Assistance N/A    Location [place]    Encounter Setting CN site    Population Status Unknown   Has own apartment at [facility]    Insurance/Financial Assistance Referral N/A    Medication Have Medication Insecurities    Medical Provider Yes    Screening Referrals Made N/A    Medical Referrals Made Non-[facility] PCP/Clinic    Medical Appointment Completed N/A    CNP Interventions Advocate/Support;Counsel;Spiritual Care;Reviewed Medications;Case Management;Educate;Navigate Healthcare System    Screenings CN Performed Blood Pressure    ED Visit Averted N/A    Life-Saving Intervention Made N/A      Questionnaire   Housing/Utilities N/A

## 2023-10-12 ENCOUNTER — Other Ambulatory Visit (HOSPITAL_COMMUNITY): Payer: Self-pay

## 2023-10-13 ENCOUNTER — Other Ambulatory Visit: Payer: Self-pay

## 2023-10-18 NOTE — Congregational Nurse Program (Signed)
  Dept: [phone]   Congregational Nurse Program Note  Date of Encounter: 10/18/2023  Clinic visit to check blood pressure, BP 136/88, pulse 96 and regular.  Reviewed medications and scheduled visits for December when he is to have infusion for MS.  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 Yes    Student Assistance N/A    Location Patient [place]    Encounter Setting CN site    Population Status Unknown   Has own apartment at [facility]    Insurance/Financial Assistance Referral N/A    Medication Have Medication Insecurities    Medical Provider Yes    Screening Referrals Made N/A    Medical Referrals Made N/A    Medical Appointment Completed N/A    CNP Interventions Advocate/Support;Counsel;Spiritual Care;Reviewed Medications;Case Management    Screenings CN Performed Blood Pressure    ED Visit Averted N/A    Life-Saving Intervention Made N/A      Questionnaire   Housing/Utilities N/A

## 2023-10-25 NOTE — Congregational Nurse Program (Signed)
  Dept: [phone]   Congregational Nurse Program Note  Date of Encounter: 10/25/2023  Clinic visit to check blood pressure and review medications. BP 117/76, pulse 98 and regular, O2 Sat 99%.  Continues to have back pain with taking medications as prescribed and heaviness of left lower leg and difficulty ambulating without use of the walker.  No falls past week, encouraged to continue the strengthening exercises.  No contact yet with PT ordered by MD.  Left message with MD office regarding referral.   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 Yes    Student Assistance N/A    Location Patient [place]    Encounter Setting CN site    Population Status Unknown   Has own apartment at [facility]    Insurance/Financial Assistance Referral N/A    Medication Have Medication Insecurities    Medical Provider Yes    Screening Referrals Made N/A    Medical Referrals Made N/A    Medical Appointment Completed N/A    CNP Interventions Advocate/Support;Counsel;Spiritual Care;Reviewed Medications;Case Management    Screenings CN Performed Blood Pressure    ED Visit Averted N/A    Life-Saving Intervention Made N/A      Questionnaire   Housing/Utilities N/A

## 2023-11-01 ENCOUNTER — Other Ambulatory Visit (HOSPITAL_COMMUNITY): Payer: Self-pay

## 2023-11-01 NOTE — Congregational Nurse Program (Signed)
  Dept: [phone]   Congregational Nurse Program Note  Date of Encounter: 11/01/2023  Clinic visit to check blood pressure and review medications, blood pressure 126/84, pulse 98 and regular.  Complaining of back pain and has been out of Gabapentin for about two weeks.  Reviewed all of medications and need to keep them on hand.  Called pharmacy for refill and notified [place] case manager that medicine refills can be picked up in AM. 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 Yes    Student Assistance N/A    Location Patient [place]    Encounter Setting CN site    Population Status Unknown   Has own apartment at [facility]    Insurance/Financial Assistance Referral N/A    Medication Have Medication Insecurities;Provided Medication Assistance    Medical Provider Yes    Screening Referrals Made N/A    Medical Referrals Made N/A    Medical Appointment Completed N/A    CNP Interventions Advocate/Support;Counsel;Spiritual Care;Reviewed Medications;Case Management    Screenings CN Performed Blood Pressure    ED Visit Averted N/A    Life-Saving Intervention Made N/A      Questionnaire   Housing/Utilities N/A

## 2023-11-01 NOTE — Congregational Nurse Program (Signed)
  Dept: [phone]   Congregational Nurse Program Note  Date of Encounter: 10/04/2023  Clinic visit to check blood pressure and discuss physical therapy referral. BP 98/65, pulse 90 and regular.  Has not been contacted by physical therapy since he does not have a telephone.  Called PCP office to have therapy evaluation rescheduled, [facility] [place] case manager to be called rather than resident to schedule PT services. 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 Yes    Student Assistance N/A    Location Patient [place]    Encounter Setting CN site    Population Status Unknown   Has own apartment at [facility]    Insurance/Financial Assistance Referral N/A    Medication Have Medication Insecurities    Medical Provider Yes    Screening Referrals Made N/A    Medical Referrals Made N/A    Medical Appointment Completed N/A    CNP Interventions Advocate/Support;Counsel;Spiritual Care;Reviewed Medications;Case Management    Screenings CN Performed Blood Pressure    ED Visit Averted N/A    Life-Saving Intervention Made N/A      Questionnaire   Housing/Utilities N/A

## 2023-11-02 ENCOUNTER — Other Ambulatory Visit (HOSPITAL_COMMUNITY): Payer: Self-pay

## 2023-11-08 ENCOUNTER — Other Ambulatory Visit (HOSPITAL_COMMUNITY): Payer: Self-pay

## 2023-11-08 NOTE — Congregational Nurse Program (Signed)
  Dept: [phone]   Congregational Nurse Program Note  Date of Encounter: 11/08/2023  Clinic visit to assist with medications and take blood pressure, BP 133/85, pulse 90 and regular.  Complaining of severe back and left leg pain.  Taking Flexeril and Gabapentin as prescribed, bottles labeled for him to help him continue to take medications correctly.  Verified that transportation arranged by case manger for his infusion for MS on tomorrow. Past Medical History: Past Medical History:  Diagnosis Date   Asthma    Back pain    Coordination impairment    Falls frequently    Impaired gait and mobility    Slow rate of speech    Stroke [facility])    2004   Vision blurred     Encounter Details:  Community Questionnaire - 11/08/23 1400       Questionnaire   Ask client: Do you give verbal consent for me to treat you today? Yes    Student Assistance N/A    Location Patient [place]    Encounter Setting CN site    Population Status Unknown   Has own apartment at [facility]    Insurance/Financial Assistance Referral N/A    Medication Have Medication Insecurities;Provided Medication Assistance    Medical Provider Yes    Screening Referrals Made N/A    Medical Referrals Made N/A    Medical Appointment Completed N/A    CNP Interventions Advocate/Support;Counsel;Spiritual Care;Reviewed Medications;Case Management;Educate    Screenings CN Performed Blood Pressure    ED Visit Averted N/A    Life-Saving Intervention Made N/A      Questionnaire   Housing/Utilities N/A

## 2023-11-11 ENCOUNTER — Other Ambulatory Visit (HOSPITAL_COMMUNITY): Payer: Self-pay

## 2023-11-11 MED ORDER — TRAMADOL HCL 50 MG PO TABS
50.0000 mg | ORAL_TABLET | Freq: Four times a day (QID) | ORAL | 0 refills | Status: DC | PRN
Start: 2023-11-11 — End: 2024-01-09
  Filled 2023-11-11: qty 60, 15d supply, fill #0
  Filled 2023-12-01: qty 28, 7d supply, fill #0

## 2023-11-15 NOTE — Congregational Nurse Program (Signed)
  Dept: [phone]   Congregational Nurse Program Note  Date of Encounter: 11/15/2023  Clinic visit to check blood pressure and follow-up from infusion for multiple sclerosis on the 11th. BP 107/71, pulse 84 and regular.  Has not felt that he has had any improvement in symptoms since having the infusion.  Larey Seat getting out of bathtub again since treatment, contacted PCP office, a tub transfer chair will be ordered to enable him to transfer more safely. Past Medical History: Past Medical History:  Diagnosis Date   Asthma    Back pain    Coordination impairment    Falls frequently    Impaired gait and mobility    Slow rate of speech    Stroke [facility])    2004   Vision blurred     Encounter Details:  Community Questionnaire - 11/15/23 1315       Questionnaire   Ask client: Do you give verbal consent for me to treat you today? Yes    Student Assistance N/A    Location Patient [place]    Encounter Setting CN site    Population Status Unknown   Has own apartment at [facility]    Insurance/Financial Assistance Referral N/A    Medication Have Medication Insecurities    Medical Provider Yes    Screening Referrals Made N/A    Medical Referrals Made N/A    Medical Appointment Completed N/A    CNP Interventions Spiritual Care;Advocate/Support;Navigate Healthcare System;Counsel;Educate    Screenings CN Performed Blood Pressure    ED Visit Averted N/A    Life-Saving Intervention Made N/A      Questionnaire   Housing/Utilities N/A

## 2023-11-21 ENCOUNTER — Telehealth: Payer: Self-pay | Admitting: Diagnostic Neuroimaging

## 2023-11-21 NOTE — Telephone Encounter (Signed)
 Pt confirming upcoming appt details

## 2023-11-22 ENCOUNTER — Other Ambulatory Visit (HOSPITAL_COMMUNITY): Payer: Self-pay

## 2023-12-01 ENCOUNTER — Emergency Department (HOSPITAL_COMMUNITY)
Admission: EM | Admit: 2023-12-01 | Discharge: 2023-12-02 | Disposition: A | Discharge status: Home / Self Care | Payer: Medicaid Other | Attending: Emergency Medicine | Admitting: Emergency Medicine

## 2023-12-01 ENCOUNTER — Other Ambulatory Visit (HOSPITAL_COMMUNITY): Payer: Self-pay

## 2023-12-01 ENCOUNTER — Emergency Department (HOSPITAL_COMMUNITY): Payer: Medicaid Other

## 2023-12-01 ENCOUNTER — Encounter (HOSPITAL_COMMUNITY): Payer: Self-pay

## 2023-12-01 ENCOUNTER — Other Ambulatory Visit: Payer: Self-pay

## 2023-12-01 DIAGNOSIS — R251 Tremor, unspecified: Secondary | ICD-10-CM | POA: Insufficient documentation

## 2023-12-01 DIAGNOSIS — R112 Nausea with vomiting, unspecified: Secondary | ICD-10-CM | POA: Insufficient documentation

## 2023-12-01 DIAGNOSIS — R1084 Generalized abdominal pain: Secondary | ICD-10-CM | POA: Diagnosis not present

## 2023-12-01 DIAGNOSIS — Z20822 Contact with and (suspected) exposure to covid-19: Secondary | ICD-10-CM | POA: Insufficient documentation

## 2023-12-01 DIAGNOSIS — R0789 Other chest pain: Secondary | ICD-10-CM | POA: Diagnosis not present

## 2023-12-01 DIAGNOSIS — D72829 Elevated white blood cell count, unspecified: Secondary | ICD-10-CM | POA: Diagnosis not present

## 2023-12-01 DIAGNOSIS — R110 Nausea: Secondary | ICD-10-CM

## 2023-12-01 LAB — CBC WITH DIFFERENTIAL/PLATELET
Abs Immature Granulocytes: 0.1 10*3/uL — ABNORMAL HIGH (ref 0.00–0.07)
Basophils Absolute: 0 10*3/uL (ref 0.0–0.1)
Basophils Relative: 0 %
Eosinophils Absolute: 0 10*3/uL (ref 0.0–0.5)
Eosinophils Relative: 0 %
HCT: 41 % (ref 39.0–52.0)
Hemoglobin: 13.6 g/dL (ref 13.0–17.0)
Immature Granulocytes: 1 %
Lymphocytes Relative: 11 %
Lymphs Abs: 1.2 10*3/uL (ref 0.7–4.0)
MCH: 31.2 pg (ref 26.0–34.0)
MCHC: 33.2 g/dL (ref 30.0–36.0)
MCV: 94 fL (ref 80.0–100.0)
Monocytes Absolute: 1 10*3/uL (ref 0.1–1.0)
Monocytes Relative: 9 %
Neutro Abs: 8.5 10*3/uL — ABNORMAL HIGH (ref 1.7–7.7)
Neutrophils Relative %: 79 %
Platelets: 248 10*3/uL (ref 150–400)
RBC: 4.36 MIL/uL (ref 4.22–5.81)
RDW: 14.1 % (ref 11.5–15.5)
WBC: 10.8 10*3/uL — ABNORMAL HIGH (ref 4.0–10.5)
nRBC: 0 % (ref 0.0–0.2)

## 2023-12-01 LAB — COMPREHENSIVE METABOLIC PANEL
ALT: 105 U/L — ABNORMAL HIGH (ref 0–44)
AST: 56 U/L — ABNORMAL HIGH (ref 15–41)
Albumin: 3.9 g/dL (ref 3.5–5.0)
Alkaline Phosphatase: 65 U/L (ref 38–126)
Anion gap: 13 (ref 5–15)
BUN: 8 mg/dL (ref 6–20)
CO2: 20 mmol/L — ABNORMAL LOW (ref 22–32)
Calcium: 9.4 mg/dL (ref 8.9–10.3)
Chloride: 105 mmol/L (ref 98–111)
Creatinine, Ser: 1.43 mg/dL — ABNORMAL HIGH (ref 0.61–1.24)
GFR, Estimated: >60 mL/min (ref >60)
Glucose, Bld: 105 mg/dL — ABNORMAL HIGH (ref 70–99)
Potassium: 3.4 mmol/L — ABNORMAL LOW (ref 3.5–5.1)
Sodium: 138 mmol/L (ref 135–145)
Total Bilirubin: 1.1 mg/dL (ref 0.0–1.2)
Total Protein: 7 g/dL (ref 6.5–8.1)

## 2023-12-01 LAB — RESP PANEL BY RT-PCR (RSV, FLU A&B, COVID)  RVPGX2
Influenza A by PCR: NEGATIVE
Influenza B by PCR: NEGATIVE
Resp Syncytial Virus by PCR: NEGATIVE
SARS Coronavirus 2 by RT PCR: NEGATIVE

## 2023-12-01 LAB — MAGNESIUM: Magnesium: 2 mg/dL (ref 1.7–2.4)

## 2023-12-01 LAB — TROPONIN I (HIGH SENSITIVITY): Troponin I (High Sensitivity): 5 ng/L (ref <18)

## 2023-12-01 LAB — CK: Total CK: 167 U/L (ref 49–397)

## 2023-12-01 MED ORDER — SODIUM CHLORIDE 0.9 % IV BOLUS
1000.0000 mL | Freq: Once | INTRAVENOUS | Status: AC
  Administered 2023-12-01: 1000 mL via INTRAVENOUS

## 2023-12-01 MED ORDER — ESCITALOPRAM OXALATE 10 MG PO TABS
10.0000 mg | ORAL_TABLET | Freq: Every day | ORAL | 0 refills | Status: DC
  Filled 2023-12-01 – 2023-12-23 (×2): qty 90, 90d supply, fill #0
  Filled 2023-12-27: qty 30, 30d supply, fill #0
  Filled 2024-01-12 – 2024-01-31 (×3): qty 30, 30d supply, fill #1
  Filled 2024-02-06 – 2024-02-29 (×2): qty 30, 30d supply, fill #2

## 2023-12-01 MED ORDER — AMLODIPINE BESYLATE 5 MG PO TABS
5.0000 mg | ORAL_TABLET | Freq: Every day | ORAL | 0 refills | Status: DC
  Filled 2023-12-01 – 2024-01-31 (×4): qty 30, 30d supply, fill #0

## 2023-12-01 MED ORDER — PANTOPRAZOLE SODIUM 40 MG IV SOLR
40.0000 mg | Freq: Once | INTRAVENOUS | Status: AC
  Administered 2023-12-01: 40 mg via INTRAVENOUS
  Filled 2023-12-01: qty 10

## 2023-12-01 MED ORDER — PANTOPRAZOLE SODIUM 20 MG PO TBEC
20.0000 mg | DELAYED_RELEASE_TABLET | Freq: Every day | ORAL | 0 refills | Status: DC
  Filled 2023-12-01: qty 30, 30d supply, fill #0

## 2023-12-01 NOTE — ED Notes (Signed)
 Discharge instructions reviewed.   Newly prescribed medications discussed. Pharmacy verified for accuracy.   Opportunity for questions and concerns provided.   Alert, oriented and ambulatory with assistive devices not present.   PTAR notified for transportation to home residence.

## 2023-12-01 NOTE — ED Triage Notes (Addendum)
 Pt made contact with 911 for severe muscle spasms severe since Sunday.   Says he has MS and has not had any medications since Sunday. Lexapro , Amlodipine , Flexaril, and Gabapentin .   Admits to generalized abdominal pain with subjective distention, nausea and several episodes of vomiting while paramedics on scene.   Recurrent falls while at home. Says he is not trying to stop his medications they were not delivered as scheduled.   4mg  zofran  administered prior to arrival.

## 2023-12-01 NOTE — ED Provider Notes (Signed)
 [place] EMERGENCY DEPARTMENT AT [facility] Provider Note   CSN: [id number] Arrival date & time: 12/01/23  1919     History  Chief Complaint  Patient presents with   Tremors    Martin Barrett is a 43 y.o. male history of stroke, MS presented with muscle spasms since Sunday.  Patient states that he has not been able to take his medications since Sunday including Lexapro , amlodipine , Flexeril , gabapentin  and that since not be able take them he is having severe muscle cramps to the point where he is having tremors and can feel both of his ankles.  Patient states this feels different than previous MS flares.  Patient states that he is also having generalized abdominal pain and states that he was vomiting multiple times with the paramedics but was given Zofran  and that this has helped.  Patient denies fevers or sick contacts.  Patient states he has some mild chest discomfort but is unsure if this is from the vomiting.  Home Medications Prior to Admission medications   Medication Sig Start Date End Date Taking? Authorizing Provider  pantoprazole  (PROTONIX ) 20 MG tablet Take 1 tablet (20 mg total) by mouth daily. 12/01/23 12/31/23 Yes Delancey Moraes, Lynwood DASEN, PA-C  amLODipine  (NORVASC ) 5 MG tablet Take 1 tablet (5 mg total) by mouth at bedtime. 09/27/23     amLODipine  (NORVASC ) 5 MG tablet Take 1 tablet (5 mg total) by mouth at bedtime. 12/01/23   Victor Lynwood DASEN, PA-C  amoxicillin  (AMOXIL ) 500 MG capsule Take 1 capsule (500 mg total) by mouth every 8 (eight) hours until finished 08/18/23     chlorhexidine  (PERIDEX ) 0.12 % solution Swish for 1 minute and then spit twice daily for 10 days. 08/18/23     cyclobenzaprine  (FLEXERIL ) 10 MG tablet Take 1 tablet (10 mg total) by mouth 2 (two) times daily as needed. 07/04/23   Penumalli, Eduard SAUNDERS, MD  cyclobenzaprine  (FLEXERIL ) 10 MG tablet Take 1 tablet (10 mg total) by mouth 2 (two) times daily as needed. 09/01/23     DULoxetine  (CYMBALTA ) 30 MG capsule Take  1 capsule (30 mg total) by mouth daily. 07/04/23   Penumalli, Eduard SAUNDERS, MD  DULoxetine  (CYMBALTA ) 30 MG capsule Take 1 capsule (30 mg total) by mouth daily. 09/01/23     escitalopram  (LEXAPRO ) 10 MG tablet Take 1 tablet (10 mg total) by mouth daily. 12/01/23   Victor Lynwood DASEN, PA-C  gabapentin  (NEURONTIN ) 300 MG capsule Take 1-2 capsules (300-600 mg total) by mouth at bedtime. 07/04/23   Penumalli, Vikram R, MD  gabapentin  (NEURONTIN ) 300 MG capsule Take 1 capsule (300 mg total) by mouth 3 (three) times daily. 09/01/23     hydrocortisone  ointment 0.5 % Apply topically 2 (two) times daily. 06/28/23     ibuprofen  (ADVIL ) 800 MG tablet Take 1 tablet every 8 hours as needed for pain 08/18/23     ocrelizumab  in sodium chloride  0.9 % 500 mL Inject 300mg  into vein on day 1 & 15. Inject 600mg  into vein every 6 months thereafter. 04/19/23   Penumalli, Vikram R, MD  traMADol  (ULTRAM ) 50 MG tablet Take 1 tablet (50 mg total) by mouth every 6 -8 hours as needed for pain. 09/30/23     traMADol  (ULTRAM ) 50 MG tablet Take 1 tablet (50 mg total) by mouth every 6-8 hours as needed for pain. 11/11/23         Allergies    Other and Strawberry (diagnostic)    Review of Systems  Review of Systems  Physical Exam Updated Vital Signs BP 125/87   Pulse (!) 104   Temp 98.2 F (36.8 C) (Oral)   Resp 11   Ht 5' 10 (1.778 m)   Wt 77.1 kg   SpO2 100%   BMI 24.39 kg/m  Physical Exam Vitals reviewed.  Constitutional:      General: He is not in acute distress. HENT:     Head: Normocephalic and atraumatic.  Eyes:     Extraocular Movements: Extraocular movements intact.     Conjunctiva/sclera: Conjunctivae normal.     Pupils: Pupils are equal, round, and reactive to light.  Cardiovascular:     Rate and Rhythm: Normal rate and regular rhythm.     Pulses: Normal pulses.     Heart sounds: Normal heart sounds.     Comments: 2+ bilateral radial/dorsalis pedis pulses with regular rate Pulmonary:     Effort: Pulmonary  effort is normal. No respiratory distress.     Breath sounds: Normal breath sounds.  Abdominal:     Palpations: Abdomen is soft.     Tenderness: There is no abdominal tenderness. There is no guarding or rebound.  Musculoskeletal:        General: Normal range of motion.     Cervical back: Normal range of motion and neck supple.     Comments: 5 out of 5 bilateral grip/leg extension strength  Skin:    General: Skin is warm and dry.     Capillary Refill: Capillary refill takes less than 2 seconds.  Neurological:     General: No focal deficit present.     Mental Status: He is alert and oriented to person, place, and time.     Comments: Sensation intact in all 4 limbs Vision grossly intact Cranial nerves III through XII intact Finger-to-nose reassuring No tremors noted or clonus  Psychiatric:        Mood and Affect: Mood normal.     ED Results / Procedures / Treatments   Labs (all labs ordered are listed, but only abnormal results are displayed) Labs Reviewed  COMPREHENSIVE METABOLIC PANEL - Abnormal; Notable for the following components:      Result Value   Potassium 3.4 (*)    CO2 20 (*)    Glucose, Bld 105 (*)    Creatinine, Ser 1.43 (*)    AST 56 (*)    ALT 105 (*)    All other components within normal limits  CBC WITH DIFFERENTIAL/PLATELET - Abnormal; Notable for the following components:   WBC 10.8 (*)    Neutro Abs 8.5 (*)    Abs Immature Granulocytes 0.10 (*)    All other components within normal limits  RESP PANEL BY RT-PCR (RSV, FLU A&B, COVID)  RVPGX2  CK  MAGNESIUM  URINALYSIS, ROUTINE W REFLEX MICROSCOPIC  TROPONIN I (HIGH SENSITIVITY)    EKG EKG Interpretation Date/Time:  Thursday December 01 2023 19:32:43 EST Ventricular Rate:  100 PR Interval:  123 QRS Duration:  93 QT Interval:  351 QTC Calculation: 453 R Axis:   93  Text Interpretation: Sinus tachycardia Borderline right axis deviation Confirmed by Cottie Cough [phone]) on 12/01/2023 8:15:43  PM  Radiology DG Chest Port 1 View Result Date: 12/01/2023 CLINICAL DATA:  Chest pain. EXAM: PORTABLE CHEST 1 VIEW COMPARISON:  08/10/2023 FINDINGS: The cardiomediastinal contours are normal. The lungs are clear. Pulmonary vasculature is normal. No consolidation, pleural effusion, or pneumothorax. No acute osseous abnormalities are seen. IMPRESSION: No active disease. Electronically Signed  By: Andrea Gasman M.D.   On: 12/01/2023 23:28    Procedures Procedures    Medications Ordered in ED Medications  sodium chloride  0.9 % bolus 1,000 mL (0 mLs Intravenous Stopped 12/01/23 2307)  pantoprazole  (PROTONIX ) injection 40 mg (40 mg Intravenous Given 12/01/23 2052)    ED Course/ Medical Decision Making/ A&P                                 Medical Decision Making Amount and/or Complexity of Data Reviewed Labs: ordered. Radiology: ordered.  Risk Prescription drug management.   Darleene GORMAN Bohr 43 y.o. presented today for tremors, abdominal pain, nausea/vomiting. Working DDx that I considered at this time includes, but not limited to, electrolyte abnormalities, rhabdomyolysis, medication intoxication/withdrawal, anemia, serotonin syndrome, neuroleptic malignant syndrome, gastritis, appendicitis, cholecystitis, hepatobiliary pathology.  R/o DDx: electrolyte abnormalities, rhabdomyolysis, medication intoxication/withdrawal, anemia, serotonin syndrome, neuroleptic malignant syndrome, gastritis, appendicitis, cholecystitis, hepatobiliary pathology: These are considered less likely due to history of present illness, physical exam, labs/imaging findings  Review of prior external notes: 11/21/2023 telephone  Unique Tests and My Interpretation:  CBC: Mild leukocytosis 10.8 Respiratory panel: Negative Magnesium: Unremarkable RX:Lwmzfjmxjaoz Troponin:Unremarkable RFE:Lwmzfjmxjaoz Chest x-ray: Unremarkable EKG: Sinus tachycardia 100 beats per minute, no ST elevations or depressions indicative of  ischemia noted, no blocks  Social Determinants of Health: none  Discussion with Independent Historian: None  Discussion of Management of Tests: None  Risk: Medium: prescription drug management  Risk Stratification Score: None  Staffed with Trifan, MD  Plan: On exam patient was uncomfortable in no distress.  On exam patient does have weakness to his left lower extremity however upon chart review this appears chronic.  Patient's exam did not show any abdominal tenderness or peritoneal signs.  With patient's history of severe muscle presents we will get CK to rule out rhabdo along with abdominal labs.  Patient did say he had some chest discomfort but in the setting of emesis do believe this to be as reflux/esophagitis related so we will give Protonix  and chest pain workup.  Repeat abdominal exam does not show any abdominal tenderness or peritoneal signs.  Patient had shared decision making I offered a CT scan however patient declined at this time as he states he feels much better after the Protonix .  After discussion with the patient patient states he would like to be discharged with refills on his prescriptions along with the Protonix  and to follow-up with his primary care provider.  Patient with p.o. challenge and if successful will discharge.  Patient was successfully p.o. challenge and is asking to leave at this time.  Patient stable to be discharged at this time.  Patient was given return precautions. Patient stable for discharge at this time.  Patient verbalized understanding of plan.  This chart was dictated using voice recognition software.  Despite best efforts to proofread,  errors can occur which can change the documentation meaning.         Final Clinical Impression(s) / ED Diagnoses Final diagnoses:  Nausea    Rx / DC Orders ED Discharge Orders          Ordered    pantoprazole  (PROTONIX ) 20 MG tablet  Daily        12/01/23 2254    amLODipine  (NORVASC ) 5 MG tablet   Daily at bedtime        12/01/23 2255    escitalopram  (LEXAPRO ) 10 MG tablet  Daily  12/01/23 2255              Victor Lynwood DASEN, PA-C 12/01/23 2332    [name], MD 12/02/23 1435

## 2023-12-01 NOTE — ED Notes (Signed)
 Oral hydration and snack crackers provided for PO Challenge.

## 2023-12-01 NOTE — Discharge Instructions (Signed)
 Please follow-up with your primary care provider regards recent ER visit.  Today your labs and imaging were reassuring and as we discussed we agreed to forego CT scan at this time.  I have prescribed for you your amlodipine  along with Lexapro  it does appear that you have refills on your Flexeril  and gabapentin  and so you will need to pick this up at her pharmacy.  I have prescribed you Protonix  to help with your abdominal pain.  If symptoms change or worsen please return to ER.

## 2023-12-02 ENCOUNTER — Other Ambulatory Visit (HOSPITAL_COMMUNITY): Payer: Self-pay

## 2023-12-02 ENCOUNTER — Other Ambulatory Visit: Payer: Self-pay

## 2023-12-02 LAB — URINALYSIS, ROUTINE W REFLEX MICROSCOPIC
Bacteria, UA: NONE SEEN
Bilirubin Urine: NEGATIVE
Glucose, UA: NEGATIVE mg/dL
Ketones, ur: 5 mg/dL — AB
Leukocytes,Ua: NEGATIVE
Nitrite: NEGATIVE
Protein, ur: NEGATIVE mg/dL
Specific Gravity, Urine: 1.009 (ref 1.005–1.030)
pH: 7 (ref 5.0–8.0)

## 2023-12-02 MED ORDER — AMLODIPINE BESYLATE 5 MG PO TABS
5.0000 mg | ORAL_TABLET | Freq: Every day | ORAL | 0 refills | Status: DC
  Filled 2023-12-02 – 2023-12-23 (×2): qty 90, 90d supply, fill #0
  Filled 2023-12-27: qty 30, 30d supply, fill #0

## 2023-12-02 NOTE — ED Notes (Signed)
 PTAR at bedside to transport the patient home.

## 2023-12-06 NOTE — Congregational Nurse Program (Signed)
  Dept: [phone]   Congregational Nurse Program Note  Date of Encounter: 12/06/2023  Clinic visit for follow-up from 12/01/23 emergency department visit for nausea and vomiting and tremor.  Noted to have tachycardia and slightly low potassium at time of ER visit. BP 118/77, pulse 111 and regular, O2 Sat 99%.  [facility] clinic and scheduled visit for Friday.  Has fallen at least 3 times since last CCNP clinic visit.  Renewed request for transfer shower chair with PCP nurse. Past Medical History: Past Medical History:  Diagnosis Date   Asthma    Back pain    Coordination impairment    Falls frequently    Impaired gait and mobility    Slow rate of speech    Stroke [facility])    2004   Vision blurred     Encounter Details:  Community Questionnaire - 12/06/23 1400       Questionnaire   Ask client: Do you give verbal consent for me to treat you today? Yes    Student Assistance N/A    Location Patient [place]    Encounter Setting CN site    Population Status Unknown   Has own apartment at [facility]    Insurance/Financial Assistance Referral N/A    Medication Have Medication Insecurities;Patient Medications Reviewed    Medical Provider Yes    Screening Referrals Made N/A    Medical Referrals Made Non-[facility] PCP/Clinic    Medical Appointment Completed Non-[facility] PCP/Clinic    CNP Interventions Spiritual Care;Advocate/Support;Navigate Healthcare System;Counsel;Educate;Case Management    Screenings CN Performed Blood Pressure    ED Visit Averted N/A    Life-Saving Intervention Made N/A      Questionnaire   Housing/Utilities N/A

## 2023-12-15 NOTE — Congregational Nurse Program (Signed)
  Dept: [phone]   Congregational Nurse Program Note  Date of Encounter: 12/13/2023  Clinic visit to check blood pressure and review medications. BP 123/88, pulse 98 and regular. Continues to complain of back pain and weakness right leg.  PCP office requested a virtual visit, unable to contact nurse practitioner at the time.  Received message from PCP office requesting medications be reviewed with resident at next visit. Past Medical History: Past Medical History:  Diagnosis Date   Asthma    Back pain    Coordination impairment    Falls frequently    Impaired gait and mobility    Slow rate of speech    Stroke [facility])    2004   Vision blurred     Encounter Details:  Community Questionnaire - 12/13/23 1600       Questionnaire   Ask client: Do you give verbal consent for me to treat you today? Yes    Student Assistance N/A    Location Patient [place]    Encounter Setting CN site    Population Status Unknown   Has own apartment at [facility]    Insurance/Financial Assistance Referral N/A    Medication Have Medication Insecurities;Patient Medications Reviewed    Medical Provider Yes    Screening Referrals Made N/A    Medical Referrals Made Non-[facility] PCP/Clinic    Medical Appointment Completed Non-[facility] PCP/Clinic    CNP Interventions Spiritual Care;Advocate/Support;Navigate Healthcare System;Counsel;Educate;Case Management    Screenings CN Performed Blood Pressure    ED Visit Averted N/A    Life-Saving Intervention Made N/A      Questionnaire   Housing/Utilities N/A

## 2023-12-16 ENCOUNTER — Telehealth: Payer: Self-pay | Admitting: Diagnostic Neuroimaging

## 2023-12-16 NOTE — Telephone Encounter (Signed)
Pt confirmed Appointment details

## 2023-12-20 NOTE — Congregational Nurse Program (Unsigned)
  Dept: [phone]   Congregational Nurse Program Note  Date of Encounter: 12/20/2023  Clinic visit to check blood pressure and review medications since taking Vraylar for depression.  Has been taking 1.5 mg daily since January 16th.  States he is feeling much better, no crying episodes.  Called PCP office regarding the medication and to verify that order for transfer shower chair was completed.  [facility] had not delivered shower chair due to resident not having a telephone for direct contact to him. [facility] called and given residents new phone number.  Past Medical History: Past Medical History:  Diagnosis Date   Asthma    Back pain    Coordination impairment    Falls frequently    Impaired gait and mobility    Slow rate of speech    Stroke [facility])    2004   Vision blurred     Encounter Details:  Community Questionnaire - 12/20/23 1525       Questionnaire   Ask client: Do you give verbal consent for me to treat you today? Yes    Student Assistance N/A    Location Patient [place]    Encounter Setting CN site    Population Status Unknown   Has own apartment at [facility]    Insurance/Financial Assistance Referral N/A    Medication Have Medication Insecurities;Patient Medications Reviewed    Medical Provider Yes    Screening Referrals Made N/A    Medical Referrals Made N/A    Medical Appointment Completed N/A    CNP Interventions Spiritual Care;Advocate/Support;Navigate Healthcare System;Counsel;Educate;Case Management    Screenings CN Performed Blood Pressure    ED Visit Averted N/A    Life-Saving Intervention Made N/A      Questionnaire   Housing/Utilities N/A

## 2023-12-23 ENCOUNTER — Other Ambulatory Visit: Payer: Self-pay

## 2023-12-23 ENCOUNTER — Other Ambulatory Visit (HOSPITAL_COMMUNITY): Payer: Self-pay

## 2023-12-23 MED ORDER — VRAYLAR 3 MG PO CAPS
3.0000 mg | ORAL_CAPSULE | Freq: Every day | ORAL | 1 refills | Status: DC
  Filled 2023-12-23 (×2): qty 90, 90d supply, fill #0
  Filled 2023-12-27 – 2024-03-27 (×2): qty 30, 30d supply, fill #0
  Filled 2024-03-30: qty 90, 90d supply, fill #0
  Filled 2024-04-03: qty 30, 30d supply, fill #0
  Filled 2024-04-13 – 2024-04-26 (×2): qty 30, 30d supply, fill #1
  Filled 2024-05-24: qty 30, 30d supply, fill #2
  Filled 2024-06-19: qty 30, 30d supply, fill #3
  Filled 2024-07-19 – 2024-07-24 (×2): qty 30, 30d supply, fill #4
  Filled 2024-08-21: qty 30, 30d supply, fill #5

## 2023-12-23 MED ORDER — TRAMADOL HCL 50 MG PO TABS
50.0000 mg | ORAL_TABLET | Freq: Four times a day (QID) | ORAL | 0 refills | Status: DC | PRN
  Filled 2023-12-23: qty 60, 15d supply, fill #0

## 2023-12-23 MED ORDER — PANTOPRAZOLE SODIUM 20 MG PO TBEC
20.0000 mg | DELAYED_RELEASE_TABLET | Freq: Every day | ORAL | 1 refills | Status: DC
  Filled 2023-12-23 (×2): qty 90, 90d supply, fill #0
  Filled 2023-12-27: qty 30, 30d supply, fill #0
  Filled 2024-01-12 – 2024-01-31 (×3): qty 30, 30d supply, fill #1
  Filled 2024-02-06 – 2024-02-29 (×2): qty 30, 30d supply, fill #2
  Filled 2024-03-27: qty 30, 30d supply, fill #3
  Filled 2024-04-13 – 2024-04-26 (×2): qty 30, 30d supply, fill #4
  Filled 2024-05-24: qty 30, 30d supply, fill #5

## 2023-12-26 ENCOUNTER — Other Ambulatory Visit: Payer: Self-pay

## 2023-12-27 ENCOUNTER — Other Ambulatory Visit (HOSPITAL_COMMUNITY): Payer: Self-pay

## 2023-12-27 ENCOUNTER — Other Ambulatory Visit (HOSPITAL_BASED_OUTPATIENT_CLINIC_OR_DEPARTMENT_OTHER): Payer: Self-pay

## 2023-12-27 ENCOUNTER — Other Ambulatory Visit: Payer: Self-pay

## 2023-12-27 NOTE — Congregational Nurse Program (Signed)
  Dept: [phone]   Congregational Nurse Program Note  Date of Encounter: 12/27/2023  Clinic visit to discuss medications and back pain.  Went to PCP on Friday, states medication were ordered but he doesn't have the yet.  Is out of Lexapro, has continued to take the Vraylar.  Called pharmacy, medications are being packaged in dose packs to ensure he is taking them correctly, to be mailed to him.  Notified pharmacy that he is out of one medicine and may run out of other medicines before dose packs received.  Pharmacy will call him to ensure he receives necessary medicines.  Back pain continues to be at level 8 or 9 most of the day, having more difficulty with weakness right leg and arm. Wearing knee brace and is doing strengthening exercises as much as possible, encouraged to continue with exercises.  Transfer shower chair delivered today, notified [place] case Production designer, theatre/television/film who will help with set-up of the chair. Past Medical History: Past Medical History:  Diagnosis Date   Asthma    Back pain    Coordination impairment    Falls frequently    Impaired gait and mobility    Slow rate of speech    Stroke [facility])    2004   Vision blurred     Encounter Details:  Community Questionnaire - 12/27/23 1330       Questionnaire   Ask client: Do you give verbal consent for me to treat you today? Yes    Student Assistance N/A    Location Patient [place]    Encounter Setting CN site    Population Status Unknown   Has own apartment at [facility]    Insurance/Financial Assistance Referral N/A    Medication Have Medication Insecurities;Patient Medications Reviewed    Medical Provider Yes    Screening Referrals Made N/A    Medical Referrals Made N/A    Medical Appointment Completed N/A    CNP Interventions Spiritual Care;Advocate/Support;Counsel;Educate;Case Management    Screenings CN Performed Blood Pressure    ED Visit Averted N/A     Life-Saving Intervention Made N/A      Questionnaire   Housing/Utilities N/A

## 2023-12-28 ENCOUNTER — Other Ambulatory Visit (HOSPITAL_COMMUNITY): Payer: Self-pay

## 2024-01-04 NOTE — Congregational Nurse Program (Signed)
  Dept: [phone]   Congregational Nurse Program Note  Date of Encounter: 01/03/2024  Clinic visit for complaint f back pain and to check blood pressure. States pain is at level 9 most of the day but has less pain but stiffness when he first get out of bed. BP 130/84, pulse 96 and regular.  Received prepackaged dose pack for his medications and is taking them correctly.  Less episodes of sadness since taking Vraylar .  Past Medical History: Past Medical History:  Diagnosis Date   Asthma    Back pain    Coordination impairment    Falls frequently    Impaired gait and mobility    Slow rate of speech    Stroke [facility])    2004   Vision blurred     Encounter Details:  Community Questionnaire - 01/03/24 1355       Questionnaire   Ask client: Do you give verbal consent for me to treat you today? Yes    Student Assistance N/A    Location Patient [place]    Encounter Setting CN site    Population Status Unknown   Has own apartment at [facility]    Insurance/Financial Assistance Referral N/A    Medication Have Medication Insecurities;Patient Medications Reviewed    Medical Provider Yes    Screening Referrals Made N/A    Medical Referrals Made N/A    Medical Appointment Completed N/A    CNP Interventions Spiritual Care;Advocate/Support;Counsel;Educate;Case Management    Screenings CN Performed Blood Pressure    ED Visit Averted N/A    Life-Saving Intervention Made N/A      Questionnaire   Housing/Utilities N/A

## 2024-01-09 ENCOUNTER — Ambulatory Visit (INDEPENDENT_AMBULATORY_CARE_PROVIDER_SITE_OTHER): Payer: Medicaid Other | Admitting: Diagnostic Neuroimaging

## 2024-01-09 ENCOUNTER — Encounter: Payer: Self-pay | Admitting: Diagnostic Neuroimaging

## 2024-01-09 VITALS — BP 129/77 | HR 98 | Ht 70.0 in | Wt 188.0 lb

## 2024-01-09 DIAGNOSIS — G35 Multiple sclerosis: Secondary | ICD-10-CM | POA: Diagnosis not present
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
## 2024-01-11 ENCOUNTER — Telehealth: Payer: Self-pay | Admitting: Diagnostic Neuroimaging

## 2024-01-11 NOTE — Telephone Encounter (Signed)
healthy blue Berkley Harvey: [phone] exp. 01/11/24-03/10/24 sent to GI [phone]

## 2024-01-12 ENCOUNTER — Other Ambulatory Visit: Payer: Self-pay

## 2024-01-16 ENCOUNTER — Other Ambulatory Visit (HOSPITAL_COMMUNITY): Payer: Self-pay

## 2024-01-17 ENCOUNTER — Telehealth: Payer: Self-pay | Admitting: Diagnostic Neuroimaging

## 2024-01-17 ENCOUNTER — Other Ambulatory Visit: Payer: Self-pay

## 2024-01-17 NOTE — Telephone Encounter (Signed)
[facility] Nurse called needing to speak to the RN regarding the pt's traMADol (ULTRAM) 50 MG tablet  She states that the pt is always complaining about back pain but she noticed that his traMADol (ULTRAM) 50 MG tablet was not renewed. Please advise.

## 2024-01-17 NOTE — Congregational Nurse Program (Signed)
Dept: [phone]   Congregational Nurse Program Note  Date of Encounter: 01/17/2024  Clinic visit to check blood pressure and for complaint of back pain.  BP 129/81, pulse 86, O2 Sat 99%.  States that back pain at level 8 to 10 almost constantly.  Review of medicine in pill package saw no pain medication in the prepackaged medicine.  Called Dr. Marylou Flesher office and left message for the nurse of the continued complaint of back pain. Past Medical History: Past Medical History:  Diagnosis Date   Asthma    Back pain    Coordination impairment    Falls frequently    Impaired gait and mobility    Slow rate of speech    Stroke [facility])    2004   Vision blurred     Encounter Details:  Community Questionnaire - 01/17/24 1400       Questionnaire   Ask client: Do you give verbal consent for me to treat you today? Yes    Student Assistance N/A    Location Patient [place]    Encounter Setting CN site    Population Status Unknown   Has own apartment at [facility]    Insurance/Financial Assistance Referral N/A    Medication Have Medication Insecurities;Patient Medications Reviewed    Medical Provider Yes    Screening Referrals Made N/A    Medical Referrals Made N/A    Medical Appointment Completed [facility]    CNP Interventions Advocate/Support;Counsel;Educate    Screenings CN Performed Blood Pressure    ED Visit Averted N/A    Life-Saving Intervention Made N/A      Questionnaire   Housing/Utilities N/A               Dept: [phone]   Congregational Nurse Program Note  Date of Encounter: 01/17/2024  Past Medical History: Past Medical History:  Diagnosis Date   Asthma    Back pain    Coordination impairment    Falls frequently    Impaired gait and mobility    Slow rate of speech    Stroke [facility])    2004   Vision blurred     Encounter Details:  Community Questionnaire - 01/17/24 1400        Questionnaire   Ask client: Do you give verbal consent for me to treat you today? Yes    Student Assistance N/A    Location Patient [place]    Encounter Setting CN site    Population Status Unknown   Has own apartment at [facility]    Insurance/Financial Assistance Referral N/A    Medication Have Medication Insecurities;Patient Medications Reviewed    Medical Provider Yes    Screening Referrals Made N/A    Medical Referrals Made N/A    Medical Appointment Completed [facility]    CNP Interventions Advocate/Support;Counsel;Educate    Screenings CN Performed Blood Pressure    ED Visit Averted N/A    Life-Saving Intervention Made N/A      Questionnaire   Housing/Utilities N/A               Dept: [phone]   Congregational Nurse Program Note  Date of Encounter: 01/17/2024   Past Medical History: Past Medical History:  Diagnosis Date   Asthma    Back pain    Coordination impairment    Falls frequently    Impaired gait and mobility    Slow rate of speech  Stroke [facility])    2004   Vision blurred     Encounter Details:  Community Questionnaire - 01/17/24 1400       Questionnaire   Ask client: Do you give verbal consent for me to treat you today? Yes    Student Assistance N/A    Location Patient [place]    Encounter Setting CN site    Population Status Unknown   Has own apartment at [facility]    Insurance/Financial Assistance Referral N/A    Medication Have Medication Insecurities;Patient Medications Reviewed    Medical Provider Yes    Screening Referrals Made N/A    Medical Referrals Made N/A    Medical Appointment Completed [facility]    CNP Interventions Advocate/Support;Counsel;Educate    Screenings CN Performed Blood Pressure    ED Visit Averted N/A    Life-Saving Intervention Made N/A      Questionnaire   Housing/Utilities N/A

## 2024-01-18 ENCOUNTER — Other Ambulatory Visit: Payer: Self-pay

## 2024-01-18 NOTE — Telephone Encounter (Signed)
Called the community nurse back and advised of the last office visit plan. Informed that we had not been the ordering provider for tramadol Advised of Dr. Richrd Humbles rec.  "CONTINUE ocrevus infusion (started July 2024; check labs q6 months) - continue gabapentin 300-600mg  at bedtime (for pain) - continue cyclobenzaprine 10mg  twice a day as needed - start duloxetine 30mg  daily for pain and depression"  She verbalized understanding and will touch base with the patient. She states that he had not been taking the flexeril. She will inform the pt to try that along with the other medication ordered by Dr Marjory Lies. If that combination doesn't work, she will touch base with PCP

## 2024-01-18 NOTE — Congregational Nurse Program (Signed)
  Dept: [phone]   Congregational Nurse Program Note  Date of Encounter: 01/18/2024  Sent email to resident regarding the medication for back pain, per Dr. Marylou Flesher [place] he should take Flexeril twice daily to see if this decreases the back pain.  This medication was not included in the prepackaged medications because its ordered as needed, only regularly ordered medicines are in the packages.  If this doesn't relieve the pain PCP office to be notified to determine if another pain medication should be ordered.    Telephone call to resident at 4:00P to follow-up on email message, he voiced understanding of the plan and will take the Flexeril twice daily until he goes to appointment with PCP.  Past Medical History: Past Medical History:  Diagnosis Date   Asthma    Back pain    Coordination impairment    Falls frequently    Impaired gait and mobility    Slow rate of speech    Stroke [facility])    2004   Vision blurred     Encounter Details:  Community Questionnaire - 01/18/24 1550       Questionnaire   Ask client: Do you give verbal consent for me to treat you today? Yes    Student Assistance N/A    Location Patient [place]    Encounter Setting Phone/Text/Email    Population Status Unknown   Has own apartment at [facility]    Insurance/Financial Assistance Referral N/A    Medication Have Medication Insecurities;Patient Medications Reviewed;Provided Medication Assistance    Medical Provider Yes    Screening Referrals Made N/A    Medical Referrals Made [facility] PCP/Clinic    Medical Appointment Completed N/A    CNP Interventions Advocate/Support;Counsel;Educate    Screenings CN Performed N/A    ED Visit Averted N/A    Life-Saving Intervention Made N/A      Questionnaire   Housing/Utilities N/A

## 2024-01-20 ENCOUNTER — Other Ambulatory Visit (HOSPITAL_COMMUNITY): Payer: Self-pay

## 2024-01-24 ENCOUNTER — Other Ambulatory Visit: Payer: Self-pay

## 2024-01-24 ENCOUNTER — Other Ambulatory Visit (HOSPITAL_COMMUNITY): Payer: Self-pay

## 2024-01-24 NOTE — Congregational Nurse Program (Signed)
  Dept: [phone]   Congregational Nurse Program Note  Date of Encounter: 01/24/2024  Telephone call to resident after [place] case manager stated that Flexeril prescription was not filled when he went to the pharmacy after resident when to PCP appointment.  Called [facility] outpatient pharmacy and verified that medication was not included in the prepackaged medicines.  Arranged to have prescription filled today to take to resident. Past Medical History: Past Medical History:  Diagnosis Date   Asthma    Back pain    Coordination impairment    Falls frequently    Impaired gait and mobility    Slow rate of speech    Stroke [facility])    2004   Vision blurred     Encounter Details:  Community Questionnaire - 01/24/24 1645       Questionnaire   Ask client: Do you give verbal consent for me to treat you today? Yes    Student Assistance N/A    Location Patient [place]    Encounter Setting Phone/Text/Email    Population Status Unknown   Has own apartment at [facility]    Insurance/Financial Assistance Referral N/A    Medication Have Medication Insecurities;Patient Medications Reviewed;Provided Medication Assistance    Medical Provider Yes    Screening Referrals Made N/A    Medical Referrals Made N/A    Medical Appointment Completed N/A    CNP Interventions Advocate/Support;Counsel;Case Management    Screenings CN Performed N/A    ED Visit Averted N/A    Life-Saving Intervention Made N/A      Questionnaire   Housing/Utilities N/A

## 2024-01-30 ENCOUNTER — Other Ambulatory Visit: Payer: Self-pay

## 2024-01-31 ENCOUNTER — Other Ambulatory Visit (HOSPITAL_COMMUNITY): Payer: Self-pay

## 2024-01-31 ENCOUNTER — Other Ambulatory Visit: Payer: Self-pay

## 2024-02-01 ENCOUNTER — Other Ambulatory Visit (HOSPITAL_COMMUNITY): Payer: Self-pay

## 2024-02-02 ENCOUNTER — Other Ambulatory Visit: Payer: Self-pay

## 2024-02-02 MED ORDER — GABAPENTIN 300 MG PO CAPS
300.0000 mg | ORAL_CAPSULE | Freq: Three times a day (TID) | ORAL | 3 refills | Status: DC
  Filled 2024-02-02 (×2): qty 90, 30d supply, fill #0
  Filled 2024-02-06 – 2024-02-29 (×2): qty 90, 30d supply, fill #1
  Filled 2024-03-06 – 2024-03-27 (×2): qty 90, 30d supply, fill #2
  Filled 2024-04-13 – 2024-04-26 (×2): qty 90, 30d supply, fill #3

## 2024-02-03 ENCOUNTER — Other Ambulatory Visit (HOSPITAL_COMMUNITY): Payer: Self-pay

## 2024-02-03 MED ORDER — BACITRACIN 500 UNIT/GM EX OINT
1.0000 | TOPICAL_OINTMENT | Freq: Every day | CUTANEOUS | 0 refills | Status: AC
  Filled 2024-02-03: qty 28, 7d supply, fill #0

## 2024-02-03 MED ORDER — SALINE NASAL SPRAY 0.65 % NA SOLN
2.0000 | Freq: Every day | NASAL | 0 refills | Status: AC
  Filled 2024-02-03: qty 44, 10d supply, fill #0

## 2024-02-06 ENCOUNTER — Other Ambulatory Visit: Payer: Self-pay

## 2024-02-14 NOTE — Congregational Nurse Program (Signed)
  Dept: [phone]   Congregational Nurse Program Note  Date of Encounter: 01/31/2024  Clinic visit to check blood pressure and discuss symptoms since change in medications.  BP 123/83, pulse 93 and regular.  Back pain level decrease from 9 to 10 on a scale of 10 to a 6 since he has been taking the Flexeril twice daily along with the other prescribed medications for nerve pain. Advised to continue taking the medications as prescribed. Past Medical History: Past Medical History:  Diagnosis Date   Asthma    Back pain    Coordination impairment    Falls frequently    Impaired gait and mobility    Slow rate of speech    Stroke [facility])    2004   Vision blurred     Encounter Details:  Community Questionnaire - 01/31/24 1355       Questionnaire   Ask client: Do you give verbal consent for me to treat you today? Yes    Student Assistance N/A    Location Patient [place]    Encounter Setting CN site    Population Status Unknown   Has own apartment at [facility]    Insurance/Financial Assistance Referral N/A    Medication Have Medication Insecurities;Patient Medications Reviewed;Provided Medication Assistance    Medical Provider Yes    Screening Referrals Made N/A    Medical Referrals Made N/A    Medical Appointment Completed N/A    CNP Interventions Advocate/Support;Counsel;Case Management;Spiritual Care    Screenings CN Performed N/A    ED Visit Averted N/A    Life-Saving Intervention Made N/A      Questionnaire   Housing/Utilities N/A

## 2024-02-14 NOTE — Congregational Nurse Program (Signed)
  Dept: [phone]   Congregational Nurse Program Note  Date of Encounter: 02/14/2024  Past Medical History: Past Medical History:  Diagnosis Date   Asthma    Back pain    Coordination impairment    Falls frequently    Impaired gait and mobility    Slow rate of speech    Stroke [facility])    2004   Vision blurred     Encounter Details:       Dept: [phone]   Congregational Nurse Program Note  Date of Encounter: 02/14/2024  Clinic visit to check blood pressure and complaint of leg pain.  Blood pressure 120/79, pulse 90, O2 Sat 98%.  Complaints that back pain is much better but right thigh pain is worse.  Called [facility] and referral made to [facility] health physical therapy and rehabilitation. Past Medical History: Past Medical History:  Diagnosis Date   Asthma    Back pain    Coordination impairment    Falls frequently    Impaired gait and mobility    Slow rate of speech    Stroke [facility])    2004   Vision blurred     Encounter Details:

## 2024-02-15 ENCOUNTER — Other Ambulatory Visit (HOSPITAL_COMMUNITY): Payer: Self-pay

## 2024-02-21 ENCOUNTER — Other Ambulatory Visit (HOSPITAL_COMMUNITY): Payer: Self-pay

## 2024-02-21 DIAGNOSIS — G35 Multiple sclerosis: Secondary | ICD-10-CM | POA: Diagnosis not present

## 2024-02-21 NOTE — Congregational Nurse Program (Signed)
  Dept: [phone]   Congregational Nurse Program Note  Date of Encounter: 02/21/2024  Clinic visit to review medications and take blood pressure, BP 115/81, pulse 98 and regular, O2 Sat 98%.  Has been taking Flexeril two times daily and Tramadol three times daily in addition to regular medications.  The Gabapentin was not included in the prepackaged daily medicines that will run out after 02/28/2024 dose pack.  Called pharmacy, was told that due to order having  expired Gabapentin was left out of march does package but will be included in th April prepackaged medicines.  Discussed appointments for this week with him and with [facility] case manager who verified transportation will be provided tomorrow for the MRI and Thursday for the PT evaluation. Past Medical History: Past Medical History:  Diagnosis Date   Asthma    Back pain    Coordination impairment    Falls frequently    Impaired gait and mobility    Slow rate of speech    Stroke [facility])    2004   Vision blurred     Encounter Details:  Community Questionnaire - 02/21/24 1420       Questionnaire   Ask client: Do you give verbal consent for me to treat you today? Yes    Student Assistance N/A    Location Patient [place]    Encounter Setting CN site    Population Status Unknown   Has own apartment at [facility]    Insurance/Financial Assistance Referral N/A    Medication Have Medication Insecurities;Patient Medications Reviewed    Medical Provider Yes    Screening Referrals Made N/A    Medical Referrals Made N/A    Medical Appointment Completed N/A    CNP Interventions Advocate/Support;Counsel;Case Management;Spiritual Care;Educate    Screenings CN Performed Blood Pressure    ED Visit Averted N/A    Life-Saving Intervention Made N/A      Questionnaire   Housing/Utilities N/A

## 2024-02-22 ENCOUNTER — Ambulatory Visit
Admission: RE | Admit: 2024-02-22 | Discharge: 2024-02-22 | Disposition: A | Discharge status: Home / Self Care | Payer: Medicaid Other | Source: Ambulatory Visit | Attending: Diagnostic Neuroimaging | Admitting: Diagnostic Neuroimaging

## 2024-02-22 DIAGNOSIS — G35 Multiple sclerosis: Secondary | ICD-10-CM

## 2024-02-22 MED ORDER — GADOPICLENOL 0.5 MMOL/ML IV SOLN
9.0000 mL | Freq: Once | INTRAVENOUS | Status: AC | PRN
  Administered 2024-02-22: 9 mL via INTRAVENOUS

## 2024-02-23 ENCOUNTER — Ambulatory Visit: Attending: Family Medicine | Admitting: Physical Therapy

## 2024-02-23 ENCOUNTER — Encounter: Payer: Self-pay | Admitting: Diagnostic Neuroimaging

## 2024-02-23 ENCOUNTER — Telehealth: Payer: Self-pay | Admitting: Physical Therapy

## 2024-02-23 VITALS — BP 135/82 | HR 104

## 2024-02-23 DIAGNOSIS — M542 Cervicalgia: Secondary | ICD-10-CM | POA: Insufficient documentation

## 2024-02-23 DIAGNOSIS — M5459 Other low back pain: Secondary | ICD-10-CM | POA: Diagnosis present

## 2024-02-23 DIAGNOSIS — R2681 Unsteadiness on feet: Secondary | ICD-10-CM | POA: Insufficient documentation

## 2024-02-23 DIAGNOSIS — R2689 Other abnormalities of gait and mobility: Secondary | ICD-10-CM | POA: Diagnosis present

## 2024-02-23 DIAGNOSIS — M6281 Muscle weakness (generalized): Secondary | ICD-10-CM | POA: Insufficient documentation
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
## 2024-02-29 ENCOUNTER — Other Ambulatory Visit (INDEPENDENT_AMBULATORY_CARE_PROVIDER_SITE_OTHER): Payer: Self-pay

## 2024-02-29 ENCOUNTER — Other Ambulatory Visit: Payer: Self-pay

## 2024-02-29 ENCOUNTER — Ambulatory Visit: Attending: Family Medicine | Admitting: Physical Therapy

## 2024-02-29 ENCOUNTER — Telehealth: Payer: Self-pay

## 2024-02-29 VITALS — BP 127/83 | HR 102

## 2024-02-29 DIAGNOSIS — M6281 Muscle weakness (generalized): Secondary | ICD-10-CM | POA: Insufficient documentation

## 2024-02-29 DIAGNOSIS — M5459 Other low back pain: Secondary | ICD-10-CM | POA: Diagnosis present

## 2024-02-29 DIAGNOSIS — R2689 Other abnormalities of gait and mobility: Secondary | ICD-10-CM | POA: Insufficient documentation

## 2024-02-29 DIAGNOSIS — R2681 Unsteadiness on feet: Secondary | ICD-10-CM | POA: Insufficient documentation

## 2024-02-29 DIAGNOSIS — Z0289 Encounter for other administrative examinations: Secondary | ICD-10-CM

## 2024-02-29 DIAGNOSIS — G35 Multiple sclerosis: Secondary | ICD-10-CM

## 2024-02-29 DIAGNOSIS — M542 Cervicalgia: Secondary | ICD-10-CM | POA: Insufficient documentation
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
## 2024-02-29 NOTE — Telephone Encounter (Signed)
 Spoke w/Pt regarding labs needed for Ocrevus. Pt stated he can be here at 10:30 as he has rehab appt at 11:00 but has to call an Martin Barrett - stated he will do his best to be here by 10:30. Pt voiced thanks for call.

## 2024-03-01 ENCOUNTER — Other Ambulatory Visit (HOSPITAL_COMMUNITY): Payer: Self-pay

## 2024-03-01 ENCOUNTER — Other Ambulatory Visit: Payer: Self-pay

## 2024-03-06 ENCOUNTER — Other Ambulatory Visit (HOSPITAL_COMMUNITY): Payer: Self-pay

## 2024-03-06 ENCOUNTER — Other Ambulatory Visit: Payer: Self-pay

## 2024-03-06 ENCOUNTER — Telehealth: Payer: Self-pay | Admitting: Physical Therapy

## 2024-03-06 NOTE — Congregational Nurse Program (Signed)
  Dept: [phone]   Congregational Nurse Program Note  Date of Encounter: 03/06/2024  Clinic visit to check blood pressure and to check on medications delivered late on yesterday that were due on Friday, April 4th.  Resident was without BP med for 3 days, BP today 136/88, pulse 97 and regular.  Weight stable at 188 Lbs.  Called [facility], spoke with Ladene Artist who manages the prepackaged medications.  Residents meds were filled on Friday as planned but not delivered until Monday, April 7th.  Made arrangements for next refills to be delivered on April 30th to ensure medicines for May will be on time. Past Medical History: Past Medical History:  Diagnosis Date   Asthma    Back pain    Coordination impairment    Falls frequently    Impaired gait and mobility    Slow rate of speech    Stroke [facility])    2004   Vision blurred     Encounter Details:  Community Questionnaire - 03/06/24 1530       Questionnaire   Ask client: Do you give verbal consent for me to treat you today? Yes    Student Assistance N/A    [place]    Encounter Setting CN site    Population Status Unknown   Has own apartment at [facility]    Insurance/Financial Assistance Referral N/A    Medication Have Medication Insecurities;Patient Medications Reviewed;Provided Medication Assistance    Medical Provider Yes    Screening Referrals Made N/A    Medical Referrals Made N/A    Medical Appointment Completed N/A    CNP Interventions Advocate/Support;Counsel;Case Management;Spiritual Care;Educate    Screenings CN Performed Blood Pressure;Weight    ED Visit Averted N/A    Life-Saving Intervention Made N/A      Questionnaire   Housing/Utilities N/A

## 2024-03-06 NOTE — Telephone Encounter (Signed)
 The following was requested via voicemail left on nurse line at [facility]:  Lazarus Salines, FNP,  Clance Boll is being seen by PT for deficits related to his MS. The patient would benefit from OT and ST evaluation for his MS as well.    If you agree, please place an order in [facility] workque in [facility] or fax the order to [phone].  Thank you,  Peter Congo, PT, DPT, [facility] [address] [place], [place]  [address] Phone:  [phone] Fax:  [phone]

## 2024-03-07 ENCOUNTER — Other Ambulatory Visit: Payer: Self-pay

## 2024-03-07 ENCOUNTER — Other Ambulatory Visit (HOSPITAL_COMMUNITY): Payer: Self-pay

## 2024-03-07 ENCOUNTER — Ambulatory Visit: Admitting: Physical Therapy

## 2024-03-07 VITALS — BP 130/90 | HR 112

## 2024-03-07 DIAGNOSIS — M542 Cervicalgia: Secondary | ICD-10-CM

## 2024-03-07 DIAGNOSIS — M6281 Muscle weakness (generalized): Secondary | ICD-10-CM

## 2024-03-07 DIAGNOSIS — M5459 Other low back pain: Secondary | ICD-10-CM

## 2024-03-07 DIAGNOSIS — R2689 Other abnormalities of gait and mobility: Secondary | ICD-10-CM

## 2024-03-07 DIAGNOSIS — R2681 Unsteadiness on feet: Secondary | ICD-10-CM
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
## 2024-03-08 LAB — IMMUNOGLOBULINS A/E/G/M, SERUM
IgA/Immunoglobulin A, Serum: 350 mg/dL (ref 90–386)
IgE (Immunoglobulin E), Serum: 24 [IU]/mL (ref 6–495)
IgG (Immunoglobin G), Serum: 1091 mg/dL (ref 603–1613)
IgM (Immunoglobulin M), Srm: 76 mg/dL (ref 20–172)

## 2024-03-09 ENCOUNTER — Encounter: Payer: Self-pay | Admitting: Diagnostic Neuroimaging

## 2024-03-14 ENCOUNTER — Ambulatory Visit: Admitting: Physical Therapy

## 2024-03-14 VITALS — BP 122/89 | HR 101

## 2024-03-14 DIAGNOSIS — M6281 Muscle weakness (generalized): Secondary | ICD-10-CM

## 2024-03-14 DIAGNOSIS — R2681 Unsteadiness on feet: Secondary | ICD-10-CM

## 2024-03-14 DIAGNOSIS — R2689 Other abnormalities of gait and mobility: Secondary | ICD-10-CM
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
## 2024-03-19 ENCOUNTER — Other Ambulatory Visit (HOSPITAL_COMMUNITY): Payer: Self-pay

## 2024-03-19 ENCOUNTER — Other Ambulatory Visit: Payer: Self-pay

## 2024-03-19 MED ORDER — ESCITALOPRAM OXALATE 10 MG PO TABS
10.0000 mg | ORAL_TABLET | Freq: Every day | ORAL | 1 refills | Status: DC
Start: 2024-03-19 — End: 2024-06-28
  Filled 2024-03-19: qty 90, 90d supply, fill #0
  Filled 2024-03-27: qty 30, 30d supply, fill #0
  Filled 2024-04-13 – 2024-04-27 (×3): qty 30, 30d supply, fill #1
  Filled 2024-05-24: qty 30, 30d supply, fill #2
  Filled 2024-06-19: qty 30, 30d supply, fill #3

## 2024-03-20 NOTE — Congregational Nurse Program (Signed)
  Dept: [phone]   Congregational Nurse Program Note  Date of Encounter: 03/20/2024  Clinic visit to discuss medications, prefilled daily medication package didn't contain Vraylar  ordered by PCP.  Telephone call to PCP to confirm that Vraylar  was left out of the prefilled daily dose package.  Resident to take Vraylar  from sample package given at PCP visit on 03/19/2024.  Outpatient pharmacy called to report left out medication.  BP 131/84, pulse 93 and regular, O2 Sat 97%,  reminded to continue to take medications as prescribed and to take pain medication prior to going to weekly physical therapy treatment. Past Medical History: Past Medical History:  Diagnosis Date   Asthma    Back pain    Coordination impairment    Falls frequently    Impaired gait and mobility    Slow rate of speech    Stroke [facility])    2004   Vision blurred     Encounter Details:  Community Questionnaire - 03/20/24 1445       Questionnaire   Ask client: Do you give verbal consent for me to treat you today? Yes    Student Assistance N/A    Location Patient [employer]    Encounter Setting CN site;Phone/Text/Email    Population Status Unknown   Has own apartment at [facility]    Insurance/Financial Assistance Referral N/A    Medication Have Medication Insecurities;Patient Medications Reviewed;Provided Medication Assistance    Medical Provider Yes    Screening Referrals Made N/A    Medical Referrals Made N/A    Medical Appointment Completed Non-[facility] PCP/Clinic    CNP Interventions Advocate/Support;Counsel;Case Management;Spiritual Care;Educate;Navigate Healthcare System    Screenings CN Performed Blood Pressure;Weight    ED Visit Averted N/A    Life-Saving Intervention Made N/A      Questionnaire   Housing/Utilities N/A

## 2024-03-21 ENCOUNTER — Ambulatory Visit: Admitting: Physical Therapy

## 2024-03-21 VITALS — BP 126/79 | HR 91

## 2024-03-21 DIAGNOSIS — R2689 Other abnormalities of gait and mobility: Secondary | ICD-10-CM

## 2024-03-21 DIAGNOSIS — M5459 Other low back pain: Secondary | ICD-10-CM

## 2024-03-21 DIAGNOSIS — M6281 Muscle weakness (generalized): Secondary | ICD-10-CM

## 2024-03-21 DIAGNOSIS — R2681 Unsteadiness on feet: Secondary | ICD-10-CM
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
## 2024-03-22 ENCOUNTER — Telehealth: Payer: Self-pay | Admitting: Physical Therapy

## 2024-03-22 DIAGNOSIS — G35A Relapsing-remitting multiple sclerosis: Secondary | ICD-10-CM

## 2024-03-22 DIAGNOSIS — G35 Multiple sclerosis: Secondary | ICD-10-CM

## 2024-03-22 DIAGNOSIS — M6281 Muscle weakness (generalized): Secondary | ICD-10-CM

## 2024-03-22 NOTE — Telephone Encounter (Signed)
 Dr. Salli Crawley,   Martin Barrett is being seen by PT for deficits related to his MS. The patient would benefit from OT and ST evaluation for his MS as well.     We have attempted to obtain this order from his PCP with no success.   If you agree, please place an order in [facility] workque in [facility] or fax the order to [phone].   Thank you, Jo Mouse, PT, DPT     [facility]  [address] [place], [place]  [address] Phone:  [phone] Fax:  [phone]

## 2024-03-27 ENCOUNTER — Other Ambulatory Visit (HOSPITAL_COMMUNITY): Payer: Self-pay

## 2024-03-27 ENCOUNTER — Other Ambulatory Visit: Payer: Self-pay

## 2024-03-27 NOTE — Congregational Nurse Program (Signed)
  Dept: [phone]   Congregational Nurse Program Note  Date of Encounter: 03/27/2024  Clinic visit to check status of medication refills.  Called [facility] to confirm that Vraylar  will be added to prepackaged daily medications and to order refill for pain medication.  Medications to be delivered to him no later than 04/02/2024.  Resident states some depression but feeling better today.  BP 126/83, discussed need to continue blood pressure medication as prescribed and the normal levels for blood pressure. Past Medical History: Past Medical History:  Diagnosis Date   Asthma    Back pain    Coordination impairment    Falls frequently    Impaired gait and mobility    Slow rate of speech    Stroke [facility])    2004   Vision blurred     Encounter Details:  Community Questionnaire - 03/27/24 1350       Questionnaire   Ask client: Do you give verbal consent for me to treat you today? Yes    Student Assistance N/A    Location Patient [employer]    Encounter Setting CN site;Phone/Text/Email    Population Status Unknown   Has own apartment at [facility]    Insurance/Financial Assistance Referral N/A    Medication Have Medication Insecurities;Patient Medications Reviewed;Provided Medication Assistance    Medical Provider Yes    Screening Referrals Made N/A    Medical Referrals Made N/A    Medical Appointment Completed N/A    CNP Interventions Advocate/Support;Counsel;Case Management;Spiritual Care;Educate;Navigate Healthcare System    Screenings CN Performed Blood Pressure;Weight    ED Visit Averted N/A    Life-Saving Intervention Made N/A      Questionnaire   Housing/Utilities N/A

## 2024-03-28 ENCOUNTER — Other Ambulatory Visit: Payer: Self-pay

## 2024-03-28 ENCOUNTER — Ambulatory Visit: Admitting: Physical Therapy

## 2024-03-28 VITALS — BP 143/93 | HR 108

## 2024-03-28 DIAGNOSIS — M6281 Muscle weakness (generalized): Secondary | ICD-10-CM

## 2024-03-28 DIAGNOSIS — R2681 Unsteadiness on feet: Secondary | ICD-10-CM

## 2024-03-28 DIAGNOSIS — M542 Cervicalgia: Secondary | ICD-10-CM

## 2024-03-28 DIAGNOSIS — R2689 Other abnormalities of gait and mobility: Secondary | ICD-10-CM

## 2024-03-28 DIAGNOSIS — M5459 Other low back pain: Secondary | ICD-10-CM
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
## 2024-03-29 ENCOUNTER — Other Ambulatory Visit: Payer: Self-pay

## 2024-03-30 ENCOUNTER — Other Ambulatory Visit: Payer: Self-pay

## 2024-03-30 MED ORDER — AMLODIPINE BESYLATE 5 MG PO TABS
5.0000 mg | ORAL_TABLET | Freq: Every evening | ORAL | 0 refills | Status: DC
  Filled 2024-03-30 (×2): qty 30, 30d supply, fill #0
  Filled 2024-04-13 – 2024-04-26 (×2): qty 30, 30d supply, fill #1
  Filled 2024-05-24: qty 30, 30d supply, fill #2

## 2024-04-02 ENCOUNTER — Other Ambulatory Visit: Payer: Self-pay

## 2024-04-03 ENCOUNTER — Other Ambulatory Visit (HOSPITAL_COMMUNITY): Payer: Self-pay

## 2024-04-03 ENCOUNTER — Other Ambulatory Visit: Payer: Self-pay

## 2024-04-04 ENCOUNTER — Ambulatory Visit: Attending: Family Medicine | Admitting: Physical Therapy

## 2024-04-04 VITALS — BP 113/80 | HR 100

## 2024-04-04 DIAGNOSIS — R278 Other lack of coordination: Secondary | ICD-10-CM | POA: Insufficient documentation

## 2024-04-04 DIAGNOSIS — R2681 Unsteadiness on feet: Secondary | ICD-10-CM | POA: Diagnosis present

## 2024-04-04 DIAGNOSIS — R41842 Visuospatial deficit: Secondary | ICD-10-CM | POA: Diagnosis present

## 2024-04-04 DIAGNOSIS — R2689 Other abnormalities of gait and mobility: Secondary | ICD-10-CM | POA: Insufficient documentation

## 2024-04-04 DIAGNOSIS — M5459 Other low back pain: Secondary | ICD-10-CM | POA: Diagnosis present

## 2024-04-04 DIAGNOSIS — R41840 Attention and concentration deficit: Secondary | ICD-10-CM | POA: Diagnosis present

## 2024-04-04 DIAGNOSIS — M6281 Muscle weakness (generalized): Secondary | ICD-10-CM | POA: Insufficient documentation

## 2024-04-04 NOTE — Congregational Nurse Program (Signed)
  Dept: [phone]   Congregational Nurse Program Note  Date of Encounter: 04/04/2024  Called to resident after receiving secure chat from PT that resident was not able to obtain ride home that was scheduled through the [place] system.  Verified that return trip was scheduled in the [place] system, sent email to [place] support team and CCNP regarding resident left without ride home.  Past Medical History: Past Medical History:  Diagnosis Date   Asthma    Back pain    Coordination impairment    Falls frequently    Impaired gait and mobility    Slow rate of speech    Stroke [facility])    2004   Vision blurred     Encounter Details:  Community Questionnaire - 04/04/24 1330       Questionnaire   Ask client: Do you give verbal consent for me to treat you today? Yes    Student Assistance N/A    [place]    Encounter Setting Phone/Text/Email    Population Status Unknown   Has own apartment at [facility]    Insurance/Financial Assistance Referral N/A    Medication N/A    Medical Provider Yes    Screening Referrals Made N/A    Medical Referrals Made N/A    Medical Appointment Completed N/A    CNP Interventions Advocate/Support;Counsel    Screenings CN Performed N/A    ED Visit Averted N/A    Life-Saving Intervention Made N/A      Questionnaire   Housing/Utilities N/A

## 2024-04-04 NOTE — Congregational Nurse Program (Signed)
  Dept: [phone]   Congregational Nurse Program Note  Date of Encounter: 04/03/2024  Resident came to clinic for assistance with transportation to PT appointment 5/07/20225.  Unable to schedule through [facility] program due to next day appointment.  Arranged for resident's transportation to and from the appointment  Past Medical History: Past Medical History:  Diagnosis Date   Asthma    Back pain    Coordination impairment    Falls frequently    Impaired gait and mobility    Slow rate of speech    Stroke [facility])    2004   Vision blurred     Encounter Details:  Community Questionnaire - 04/03/24 1600       Questionnaire   Ask client: Do you give verbal consent for me to treat you today? Yes    Student Assistance N/A    Location [place]    Encounter Setting CN site    Population Status Unknown   Has own apartment at [facility]    Insurance/Financial Assistance Referral N/A    Medication Have Medication Insecurities;Patient Medications Reviewed    Medical Provider Yes    Screening Referrals Made N/A    Medical Referrals Made N/A    Medical Appointment Completed N/A    CNP Interventions Advocate/Support;Counsel;Case Management;Spiritual Care    Screenings CN Performed N/A    ED Visit Averted N/A    Life-Saving Intervention Made N/A      Questionnaire   Housing/Utilities N/A
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
## 2024-04-09 NOTE — Telephone Encounter (Signed)
 Orders entered
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
## 2024-04-11 ENCOUNTER — Encounter: Payer: Self-pay | Admitting: Occupational Therapy

## 2024-04-11 ENCOUNTER — Ambulatory Visit: Admitting: Occupational Therapy

## 2024-04-11 ENCOUNTER — Ambulatory Visit: Admitting: Physical Therapy

## 2024-04-11 VITALS — BP 128/81 | HR 89

## 2024-04-11 DIAGNOSIS — R2689 Other abnormalities of gait and mobility: Secondary | ICD-10-CM

## 2024-04-11 DIAGNOSIS — R41840 Attention and concentration deficit: Secondary | ICD-10-CM

## 2024-04-11 DIAGNOSIS — R2681 Unsteadiness on feet: Secondary | ICD-10-CM

## 2024-04-11 DIAGNOSIS — M6281 Muscle weakness (generalized): Secondary | ICD-10-CM | POA: Diagnosis not present

## 2024-04-11 DIAGNOSIS — R41842 Visuospatial deficit: Secondary | ICD-10-CM

## 2024-04-11 DIAGNOSIS — M5459 Other low back pain: Secondary | ICD-10-CM

## 2024-04-11 DIAGNOSIS — R278 Other lack of coordination: Secondary | ICD-10-CM

## 2024-04-11 NOTE — Congregational Nurse Program (Signed)
  Dept: [phone]   Congregational Nurse Program Note  Date of Encounter: 04/11/2024  Telephone call to resident after receiving message form the occupational therapist that he requires a different shower chair and if possible and air fryer.  Due to his frequent falls frying with oil is hazardous.  Resident received a regular shower chair not a transfer type chair.  [facility] has ordered aide services for him, will follow-up on the equipment and aide services. Past Medical History: Past Medical History:  Diagnosis Date   Asthma    Back pain    Coordination impairment    Falls frequently    Impaired gait and mobility    Slow rate of speech    Stroke [facility])    2004   Vision blurred     Encounter Details:
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
## 2024-04-13 ENCOUNTER — Other Ambulatory Visit: Payer: Self-pay

## 2024-04-13 ENCOUNTER — Other Ambulatory Visit (HOSPITAL_COMMUNITY): Payer: Self-pay

## 2024-04-17 ENCOUNTER — Other Ambulatory Visit: Payer: Self-pay | Admitting: Diagnostic Neuroimaging

## 2024-04-17 ENCOUNTER — Other Ambulatory Visit (HOSPITAL_COMMUNITY): Payer: Self-pay

## 2024-04-17 NOTE — Congregational Nurse Program (Signed)
  Dept: [phone]   Congregational Nurse Program Note  Date of Encounter: 04/17/2024  Clinic visit to check blood pressure and discuss medications.  BP 130/86, pulse 96, O2 Sat 99%.  States back pain is almost constant but worse when he get up in the morning.  Reviewed medications and when he can take pain medication.   Past Medical History: Past Medical History:  Diagnosis Date   Asthma    Back pain    Coordination impairment    Falls frequently    Impaired gait and mobility    Slow rate of speech    Stroke [facility])    2004   Vision blurred     Encounter Details:  Community Questionnaire - 04/17/24 1425       Questionnaire   Ask client: Do you give verbal consent for me to treat you today? Yes    Student Assistance N/A    Location Patient [place]    Encounter Setting CN site    Population Status Unknown   Has own apartment at [facility]    Insurance/Financial Assistance Referral N/A    Medication Patient Medications Reviewed    Medical Provider Yes    Screening Referrals Made N/A    Medical Referrals Made N/A    Medical Appointment Completed N/A    CNP Interventions Advocate/Support;Counsel;Case Management;Spiritual Care;Educate;Navigate Healthcare System    Screenings CN Performed Blood Pressure    ED Visit Averted N/A    Life-Saving Intervention Made N/A      Questionnaire   Housing/Utilities N/A

## 2024-04-18 ENCOUNTER — Ambulatory Visit: Admitting: Physical Therapy

## 2024-04-18 ENCOUNTER — Other Ambulatory Visit (HOSPITAL_COMMUNITY): Payer: Self-pay

## 2024-04-18 MED ORDER — TRAMADOL HCL 50 MG PO TABS
50.0000 mg | ORAL_TABLET | Freq: Four times a day (QID) | ORAL | 0 refills | Status: DC | PRN
  Filled 2024-04-18: qty 20, 5d supply, fill #0
  Filled 2024-05-08 – 2024-05-22 (×3): qty 20, 5d supply, fill #1
  Filled 2024-06-12 (×2): qty 20, 5d supply, fill #2

## 2024-04-18 MED ORDER — CYCLOBENZAPRINE HCL 10 MG PO TABS
10.0000 mg | ORAL_TABLET | Freq: Two times a day (BID) | ORAL | 3 refills | Status: DC | PRN
  Filled 2024-04-18: qty 60, 30d supply, fill #0
  Filled 2024-05-22 (×2): qty 60, 30d supply, fill #1
  Filled 2024-06-12 – 2024-06-19 (×3): qty 60, 30d supply, fill #2

## 2024-04-19 ENCOUNTER — Other Ambulatory Visit (HOSPITAL_COMMUNITY): Payer: Self-pay

## 2024-04-25 ENCOUNTER — Ambulatory Visit: Admitting: Physical Therapy

## 2024-04-26 ENCOUNTER — Other Ambulatory Visit: Payer: Self-pay

## 2024-04-27 ENCOUNTER — Other Ambulatory Visit: Payer: Self-pay

## 2024-05-01 NOTE — Congregational Nurse Program (Signed)
  Dept: [phone]   Congregational Nurse Program Note  Date of Encounter: 04/24/2024  Resident unable to walk to the clinic due to bad weather, talked per telephone to make arrangements for kitchen appliances recommended by the occupational therapist to prevent injuries while cooking. Past Medical History: Past Medical History:  Diagnosis Date   Asthma    Back pain    Coordination impairment    Falls frequently    Impaired gait and mobility    Slow rate of speech    Stroke [facility])    2004   Vision blurred     Encounter Details:  Community Questionnaire - 04/24/24 1500       Questionnaire   Ask client: Do you give verbal consent for me to treat you today? Yes    Student Assistance N/A    Location Patient [employer]    Encounter Setting CN site;Phone/Text/Email    Population Status Unknown   Has own apartment at [facility]    Insurance/Financial Assistance Referral N/A    Medication Patient Medications Reviewed    Medical Provider Yes    Screening Referrals Made N/A    Medical Referrals Made N/A    Medical Appointment Completed N/A    CNP Interventions Advocate/Support;Counsel;Case Management    Screenings CN Performed N/A    ED Visit Averted N/A    Life-Saving Intervention Made N/A      Questionnaire   Housing/Utilities N/A

## 2024-05-02 ENCOUNTER — Ambulatory Visit: Admitting: Physical Therapy

## 2024-05-02 ENCOUNTER — Ambulatory Visit: Attending: Family Medicine | Admitting: Occupational Therapy

## 2024-05-02 ENCOUNTER — Encounter: Payer: Self-pay | Admitting: Occupational Therapy

## 2024-05-02 DIAGNOSIS — R41840 Attention and concentration deficit: Secondary | ICD-10-CM | POA: Insufficient documentation

## 2024-05-02 DIAGNOSIS — R471 Dysarthria and anarthria: Secondary | ICD-10-CM | POA: Insufficient documentation

## 2024-05-02 DIAGNOSIS — R41842 Visuospatial deficit: Secondary | ICD-10-CM | POA: Diagnosis present

## 2024-05-02 DIAGNOSIS — R278 Other lack of coordination: Secondary | ICD-10-CM | POA: Insufficient documentation

## 2024-05-02 DIAGNOSIS — M6281 Muscle weakness (generalized): Secondary | ICD-10-CM | POA: Diagnosis present

## 2024-05-02 DIAGNOSIS — R41841 Cognitive communication deficit: Secondary | ICD-10-CM | POA: Diagnosis present

## 2024-05-02 DIAGNOSIS — R2681 Unsteadiness on feet: Secondary | ICD-10-CM | POA: Diagnosis present

## 2024-05-02 DIAGNOSIS — R1310 Dysphagia, unspecified: Secondary | ICD-10-CM | POA: Insufficient documentation
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
## 2024-05-08 ENCOUNTER — Other Ambulatory Visit (HOSPITAL_COMMUNITY): Payer: Self-pay

## 2024-05-08 ENCOUNTER — Other Ambulatory Visit: Payer: Self-pay

## 2024-05-08 MED ORDER — DOCUSATE SODIUM 100 MG PO CAPS
100.0000 mg | ORAL_CAPSULE | Freq: Every day | ORAL | 1 refills | Status: AC
  Filled 2024-05-08: qty 90, 90d supply, fill #0

## 2024-05-08 NOTE — Congregational Nurse Program (Signed)
  Dept: [phone]   Congregational Nurse Program Note  Date of Encounter: 05/08/2024  Clinic visit to check on medication refill and to discuss upcoming appointments and the new kitchen appliance.  Notified occupational therapist that air fryer was delivered today.  Resident has infusion medication for MS tomorrow and may not get appliance setup until after appointment.  Called pharmacy to order refill, notified Partnership CM that refill can be picked up on Wednesday. Past Medical History: Past Medical History:  Diagnosis Date   Asthma    Back pain    Coordination impairment    Falls frequently    Impaired gait and mobility    Slow rate of speech    Stroke [facility])    2004   Vision blurred     Encounter Details:  Community Questionnaire - 05/08/24 1400       Questionnaire   Ask client: Do you give verbal consent for me to treat you today? Yes    Student Assistance N/A    Location Patient [place]    Encounter Setting CN site    Population Status Unknown   Has own apartment at [facility]    Insurance/Financial Assistance Referral N/A    Medication Patient Medications Reviewed    Medical Provider Yes    Screening Referrals Made N/A    Medical Referrals Made N/A    Medical Appointment Completed N/A    CNP Interventions Advocate/Support;Counsel;Case Management;Navigate Healthcare System    Screenings CN Performed N/A    ED Visit Averted N/A    Life-Saving Intervention Made N/A      Questionnaire   Housing/Utilities N/A

## 2024-05-09 ENCOUNTER — Ambulatory Visit: Admitting: Physical Therapy

## 2024-05-09 ENCOUNTER — Ambulatory Visit: Admitting: Occupational Therapy

## 2024-05-09 ENCOUNTER — Other Ambulatory Visit: Payer: Self-pay

## 2024-05-16 ENCOUNTER — Ambulatory Visit: Admitting: Occupational Therapy

## 2024-05-16 ENCOUNTER — Ambulatory Visit: Admitting: Physical Therapy

## 2024-05-16 ENCOUNTER — Ambulatory Visit

## 2024-05-16 ENCOUNTER — Encounter: Payer: Self-pay | Admitting: Occupational Therapy

## 2024-05-16 DIAGNOSIS — M6281 Muscle weakness (generalized): Secondary | ICD-10-CM | POA: Diagnosis not present

## 2024-05-16 DIAGNOSIS — R278 Other lack of coordination: Secondary | ICD-10-CM

## 2024-05-16 DIAGNOSIS — R1310 Dysphagia, unspecified: Secondary | ICD-10-CM

## 2024-05-16 DIAGNOSIS — R471 Dysarthria and anarthria: Secondary | ICD-10-CM

## 2024-05-16 DIAGNOSIS — R41840 Attention and concentration deficit: Secondary | ICD-10-CM

## 2024-05-16 DIAGNOSIS — R41841 Cognitive communication deficit: Secondary | ICD-10-CM

## 2024-05-16 DIAGNOSIS — R2681 Unsteadiness on feet: Secondary | ICD-10-CM

## 2024-05-16 DIAGNOSIS — R41842 Visuospatial deficit: Secondary | ICD-10-CM
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
## 2024-05-16 NOTE — Therapy (Signed)
 OUTPATIENT SPEECH LANGUAGE PATHOLOGY EVALUATION   Patient Name: Martin Barrett MRN: [id number] DOB:March 21, 1981, 43 y.o., male Today's Date: 05/16/2024  PCP: Jonathon Neighbors, MD REFERRING PROVIDER: Omega Bible, MD  END OF SESSION:  End of Session - 05/16/24 1235     Visit Number 1    Number of Visits 9    Date for SLP Re-Evaluation 07/11/24    Authorization Type [employer] [place] -auth requested today via email    SLP Start Time 1318    SLP Stop Time  1400    SLP Time Calculation (min) 42 min    Activity Tolerance Patient tolerated treatment well          Past Medical History:  Diagnosis Date   Asthma    Back pain    Coordination impairment    Falls frequently    Impaired gait and mobility    Slow rate of speech    Stroke (HCC)    2004   Vision blurred    Past Surgical History:  Procedure Laterality Date   FACIAL COSMETIC SURGERY     HERNIA REPAIR     Patient Active Problem List   Diagnosis Date Noted   Bed bug bite 12/14/2018    ONSET DATE: 04/09/2024 (referral date)   REFERRING DIAG: G35 (ICD-10-CM) - Multiple sclerosis, relapsing-remitting  THERAPY DIAG:  Dysphagia, unspecified type  Dysarthria and anarthria  Cognitive communication deficit  Rationale for Evaluation and Treatment: Rehabilitation  SUBJECTIVE:   SUBJECTIVE STATEMENT: My speech is slower and lower than 1 year ago  Pt accompanied by: self  PERTINENT HISTORY: MS (diagnosed about 1 year ago), asthma, stroke, back pain  PAIN: Are you having pain? Yes: NPRS scale: 8/10 Pain location: lower back Pain description: chronic, shooting pains Aggravating factors: unknown Relieving factors: pain medication, pressure  FALLS: Has patient fallen in last 6 months?  Yes, See PT evaluation for details  LIVING ENVIRONMENT: Lives with: lives alone Lives in: House/apartment  PLOF:  Level of assistance: Independent with ADLs, Independent with IADLs Employment: Chef  PATIENT GOALS:  optimize return to baseline   OBJECTIVE:  Note: Objective measures were completed at Evaluation unless otherwise noted.  COGNITION: Overall cognitive status: Impaired Areas of impairment:  Attention: Impaired: Alternating, Divided Memory: Impaired: Working, Teacher, music term, Prospective Executive function: Impaired: Problem solving, Error awareness, and Slow processing Functional deficits: Endorsed difficulty recalling family members name, misplacing commonly used items, and overall delayed processing   AUDITORY COMPREHENSION: Overall auditory comprehension: Appears intact  READING COMPREHENSION: Impacted by visual deficits   EXPRESSION: verbal  VERBAL EXPRESSION: Level of generative/spontaneous verbalization: conversation Automatic speech: name: intact and social response: intact  Interfering components: speech intelligibility and processing speed Effective technique: extra processing time  Comments: Indicated word retrieval difficulty with no overt episodes appreciated today. Endorsed compensating with extra processing time   WRITTEN EXPRESSION: Dominant hand: left Written expression: Impaired per patient report   MOTOR SPEECH: Overall motor speech: impaired Level of impairment: Conversation Respiration: thoracic breathing and clavicular breathing Phonation: breathy, hoarse, and low vocal intensity Resonance: hyponasality Articulation: Impaired: conversation Intelligibility: Intelligibility reduced (mildly) Motor planning: Impaired: inconsistent Interfering components: MS Effective technique: slow rate, increased vocal intensity, over articulate, and pacing  ORAL MOTOR EXAMINATION: Overall status: Impaired:   Lingual: Bilateral (Strength and Coordination) Comments: 4 seconds for sustained /a/   CLINICAL SWALLOW ASSESSMENT:   Current diet: regular and thin liquids Dentition: adequate natural dentition Patient directly observed with POs: Yes: regular and thin liquids  Feeding: able to feed self (reportedly drops some food) Liquids provided by: cup [facility] Protocol: Passed with no overt s/sx of aspiration  Oral phase signs and symptoms: none Pharyngeal phase signs and symptoms: complaints of residue Comments: Endorsed sensation of need to cough x1 but did not cough d/t woman present. Encouraged strong cough with rationale for airway protection; able to demo seemingly adequate cough strength. [place] x1   Patient reports: Endorsed need for frequent liquid washes; frequent choking/coughing at meals 1-2x/day   PATIENT REPORTED OUTCOME MEASURES (PROM): EAT-10: 22, Communication Participation Item Bank (CPIB): 9, and Memory Mistake Questionnaire (MMQ): 51                                                                                                                             TREATMENT DATE:  05/16/24: eval only   PATIENT EDUCATION: Education details: POC Person educated: Patient Education method: Explanation Education comprehension: verbalized understanding and needs further education   GOALS: Goals reviewed with patient? Yes  SHORT TERM GOALS: Target date: 06/13/2024  Pt will carryover recommended swallow precautions with no overt s/sx of aspiration exhibited given rare min A  Baseline: reported s/sx of aspiration  Goal status: INITIAL  2.  Pt will demonstrate dysarthria strategies during structured conversations x2 given occasional min A  Baseline: reduced volume & articulatory precision  Goal status: INITIAL  3.  Pt will implement cognitive strategies to optimize recall of personally relevant information (I.e., children's names, household items) > 1 week  Baseline: memory mistakes  Goal status: INITIAL   LONG TERM GOALS: Target date: 07/11/2024  Pt will complete objective swallow study if warranted  Baseline: no prior objective swallow measures Goal status: INITIAL  2.  Pt will carryover dysarthria strategies in unstructured  conversations x2 given rare min A  Baseline: reduced volume & articulatory precision  Goal status: INITIAL  3.  Pt will ID appropriate cognitive strategies to support cognitive functioning for encountered situations at home with rare min A Baseline: not compensating Goal status: INITIAL  4.  Pt will subjectively report improved functioning via PROMs by 2 points each by LTG  Baseline: EAT-10: 22, CPIB: 9, and MMQ: 51  Goal status: INITIAL   ASSESSMENT:  CLINICAL IMPRESSION: Patient is a 43 y.o. M who was seen today for ST evaluation for Multiple Sclerosis (MS). Dx with MS within last year. New onset dysarthria resulting in slowed rate and lower volume impacting communication effectiveness. Reported some word retrieval difficulty with benefit of extra processing time. Cognitive changes reported including inability to recall daughter's name, misplacing commonly used items, and forgetting information in conversation. Difficulty swallowing solids indicated with need for frequent liquid washes. C/o coughing at meals 1-2x/day. OME revealed decreased lingual coordination. Pt reported anterior spillage while eating/drinking. Not currently avoiding any foods. Passed Yale Protocol today. No overt s/sx of aspiration exhibited during clinical swallow exam; however, pt endorsed need to cough after completing trials but did not elicit spontaneously.  Recommended coughing if sensation evident, sitting upright during meals, slow rate, and frequent liquid washes. Given change in baseline functioning, pt would benefit from skilled ST intervention to optimize safety during swallowing, communication effectiveness, and functional independence.   OBJECTIVE IMPAIRMENTS: include attention, memory, executive functioning, dysarthria, and dysphagia. These impairments are limiting patient from household responsibilities, ADLs/IADLs, effectively communicating at home and in community, and safety when swallowing. Factors  affecting potential to achieve goals and functional outcome are co-morbidities. Patient will benefit from skilled SLP services to address above impairments and improve overall function.  REHAB POTENTIAL: Good  PLAN:  SLP FREQUENCY: 1-2x/week  SLP DURATION: 8 weeks  PLANNED INTERVENTIONS: Aspiration precaution training, Pharyngeal strengthening exercises, Diet toleration management , Language facilitation, Environmental controls, Cueing hierachy, Cognitive reorganization, Internal/external aids, Functional tasks, Multimodal communication approach, SLP instruction and feedback, Compensatory strategies, Patient/family education, [phone] Treatment of speech (30 or 45 min) , and 60454 Treatment of swallowing function  Check all possible CPT codes: 92507 - SLP treatment and 09811 - Swallowing treatment    Check all conditions that are expected to impact treatment: Neurological condition and/or seizures   If treatment provided at initial evaluation, no treatment charged due to lack of authorization.    Tamar Fairly, CCC-SLP 05/16/2024, 2:38 PM

## 2024-05-21 ENCOUNTER — Other Ambulatory Visit (HOSPITAL_COMMUNITY): Payer: Self-pay

## 2024-05-22 ENCOUNTER — Other Ambulatory Visit (HOSPITAL_COMMUNITY): Payer: Self-pay

## 2024-05-22 ENCOUNTER — Other Ambulatory Visit: Payer: Self-pay

## 2024-05-22 NOTE — Congregational Nurse Program (Signed)
  Dept: [phone]   Congregational Nurse Program Note  Date of Encounter: 05/22/2024  Clinic visit to check blood pressure and check on status of prescription refills for back pain.  BP 136/83, pulse 84 and regular, O2 Sat 97%. Called pharmacy to verify if refills for pain medicine and muscle relaxant were available, case manager will pick them up on 6/25.  Called PCP office regarding order for wheelchair, order placed on 05/08/24, notified resident via email that he will receive a telephone call for the medical equipment company  Past Medical History: Past Medical History:  Diagnosis Date   Asthma    Back pain    Coordination impairment    Falls frequently    Impaired gait and mobility    Slow rate of speech    Stroke [facility])    2004   Vision blurred     Encounter Details:  Community Questionnaire - 05/22/24 1440       Questionnaire   Ask client: Do you give verbal consent for me to treat you today? Yes    Student Assistance N/A    [place]    Encounter Setting CN site    Population Status Unknown   Has own apartment at [facility]    Insurance/Financial Assistance Referral N/A    Medication Patient Medications Reviewed;Provided Medication Assistance    Medical Provider Yes    Screening Referrals Made N/A    Medical Referrals Made N/A    Medical Appointment Completed N/A    CNP Interventions Advocate/Support;Counsel;Case Management;Navigate Healthcare System    Screenings CN Performed Blood Pressure    ED Visit Averted N/A    Life-Saving Intervention Made N/A      Questionnaire   Housing/Utilities N/A

## 2024-05-23 ENCOUNTER — Encounter: Payer: Self-pay | Admitting: Occupational Therapy

## 2024-05-23 ENCOUNTER — Ambulatory Visit: Admitting: Speech Pathology

## 2024-05-23 ENCOUNTER — Ambulatory Visit: Admitting: Occupational Therapy

## 2024-05-23 NOTE — Therapy (Signed)
 This is to document my attempt to call patient after no-show for OT and ST appt this AM.  This is patient's # 2 missed appt.   Primary phone number(s) was used in efforts to contact the patient.   Spoke to patient to remind them of clinic attendance policy and upcoming therapy visits. Pt reports he was unable to get transportation today but has transportation for visits next week, 7/2.

## 2024-05-24 ENCOUNTER — Encounter (HOSPITAL_COMMUNITY): Payer: Self-pay

## 2024-05-24 ENCOUNTER — Other Ambulatory Visit (HOSPITAL_COMMUNITY): Payer: Self-pay

## 2024-05-24 ENCOUNTER — Other Ambulatory Visit: Payer: Self-pay

## 2024-05-24 MED ORDER — GABAPENTIN 300 MG PO CAPS
300.0000 mg | ORAL_CAPSULE | Freq: Three times a day (TID) | ORAL | 3 refills | Status: DC
  Filled 2024-05-24 (×2): qty 90, 30d supply, fill #0
  Filled 2024-06-19: qty 90, 30d supply, fill #1
  Filled 2024-07-19 – 2024-07-24 (×2): qty 90, 30d supply, fill #2
  Filled 2024-08-21: qty 90, 30d supply, fill #3
  Filled ????-??-??: fill #0

## 2024-05-25 ENCOUNTER — Other Ambulatory Visit: Payer: Self-pay

## 2024-05-28 ENCOUNTER — Other Ambulatory Visit: Payer: Self-pay

## 2024-05-29 NOTE — Therapy (Unsigned)
 OUTPATIENT SPEECH LANGUAGE PATHOLOGYTREATMENT   Patient Name: Martin Barrett MRN: [id number] DOB:[birth year], 43 y.o., male Today's Date: 05/30/2024  PCP: Benjamine Aland, MD REFERRING PROVIDER: Margaret Eduard SAUNDERS, MD  END OF SESSION:  End of Session - 05/30/24 1133     Visit Number 2    Number of Visits 9    Date for SLP Re-Evaluation 07/11/24    Authorization Type Medicaid [place] -auth requested    SLP Start Time 1145    SLP Stop Time  1230    SLP Time Calculation (min) 45 min    Activity Tolerance Patient tolerated treatment well           Past Medical History:  Diagnosis Date   Asthma    Back pain    Coordination impairment    Falls frequently    Impaired gait and mobility    Slow rate of speech    Stroke (HCC)    2004   Vision blurred    Past Surgical History:  Procedure Laterality Date   FACIAL COSMETIC SURGERY     HERNIA REPAIR     Patient Active Problem List   Diagnosis Date Noted   Bed bug bite 12/14/2018    ONSET DATE: 04/09/2024 (referral date)   REFERRING DIAG: G35 (ICD-10-CM) - Multiple sclerosis, relapsing-remitting  THERAPY DIAG:  Cognitive communication deficit  Dysarthria and anarthria  Dysphagia, unspecified type  Rationale for Evaluation and Treatment: Rehabilitation  SUBJECTIVE:   SUBJECTIVE STATEMENT: Reported falls x3 since ST evaluation  Pt accompanied by: self  PERTINENT HISTORY: MS (diagnosed about 1 year ago), asthma, stroke, back pain  PAIN: Are you having pain? Yes: NPRS scale: 8/10 Pain location: lower back Pain description: chronic, shooting pains Aggravating factors: unknown Relieving factors: pain medication, pressure  FALLS: Has patient fallen in last 6 months?  Yes, See PT evaluation for details  LIVING ENVIRONMENT: Lives with: lives alone Lives in: House/apartment  PLOF:  Level of assistance: Independent with ADLs, Independent with IADLs Employment: Chef  PATIENT GOALS: optimize return to  baseline   OBJECTIVE:  Note: Objective measures were completed at Evaluation unless otherwise noted.  COGNITION: Overall cognitive status: Impaired Areas of impairment:  Attention: Impaired: Alternating, Divided Memory: Impaired: Working, Teacher, music term, Prospective Executive function: Impaired: Problem solving, Error awareness, and Slow processing Functional deficits: Endorsed difficulty recalling family members name, misplacing commonly used items, and overall delayed processing   AUDITORY COMPREHENSION: Overall auditory comprehension: Appears intact  READING COMPREHENSION: Impacted by visual deficits   EXPRESSION: verbal  VERBAL EXPRESSION: Level of generative/spontaneous verbalization: conversation Automatic speech: name: intact and social response: intact  Interfering components: speech intelligibility and processing speed Effective technique: extra processing time  Comments: Indicated word retrieval difficulty with no overt episodes appreciated today. Endorsed compensating with extra processing time   WRITTEN EXPRESSION: Dominant hand: left Written expression: Impaired per patient report   MOTOR SPEECH: Overall motor speech: impaired Level of impairment: Conversation Respiration: thoracic breathing and clavicular breathing Phonation: breathy, hoarse, and low vocal intensity Resonance: hyponasality Articulation: Impaired: conversation Intelligibility: Intelligibility reduced (mildly) Motor planning: Impaired: inconsistent Interfering components: MS Effective technique: slow rate, increased vocal intensity, over articulate, and pacing  ORAL MOTOR EXAMINATION: Overall status: Impaired:   Lingual: Bilateral (Strength and Coordination) Comments: 4 seconds for sustained /a/   CLINICAL SWALLOW ASSESSMENT:   Current diet: regular and thin liquids Dentition: adequate natural dentition Patient directly observed with POs: Yes: regular and thin liquids  Feeding: able to feed  self (reportedly  drops some food) Liquids provided by: cup [place] Protocol: Passed with no overt s/sx of aspiration  Oral phase signs and symptoms: none Pharyngeal phase signs and symptoms: complaints of residue Comments: Endorsed sensation of need to cough x1 but did not cough d/t woman present. Encouraged strong cough with rationale for airway protection; able to demo seemingly adequate cough strength. [place] x1   Patient reports: Endorsed need for frequent liquid washes; frequent choking/coughing at meals 1-2x/day   PATIENT REPORTED OUTCOME MEASURES (PROM): EAT-10: 22, Communication Participation Item Bank (CPIB): 9, and Memory Mistake Questionnaire (MMQ): 51                                                                                                                             TREATMENT DATE:  05/30/24: Reported swallowing has been okay. Self-implementing chin tuck independently. Recommended trialing neural head positioning, slow rate, small bites, and liquid washes. Pt verbalized understanding. Initiated education and instruction of dysarthria and cognitive compensations, with handouts provided. Assisted with setting up notepad app and audiobook app on phone. Instructed adding previously forgotten information into notepad app to support recall. Demonstrated how to utilize associations, repetition, and visualization to aid recall. Pt able to demo with rare min A.  05/16/24: eval only   PATIENT EDUCATION: Education details: POC Person educated: Patient Education method: Explanation Education comprehension: verbalized understanding and needs further education   GOALS: Goals reviewed with patient? Yes  SHORT TERM GOALS: Target date: 06/13/2024  Pt will carryover recommended swallow precautions with no overt s/sx of aspiration exhibited given rare min A  Baseline: reported s/sx of aspiration  Goal status: ONGOING  2.  Pt will demonstrate dysarthria strategies during structured  conversations x2 given occasional min A  Baseline: reduced volume & articulatory precision  Goal status: ONGOING  3.  Pt will implement cognitive strategies to optimize recall of personally relevant information (I.e., children's names, household items) > 1 week  Baseline: memory mistakes  Goal status: ONGOING   LONG TERM GOALS: Target date: 07/11/2024  Pt will complete objective swallow study if warranted  Baseline: no prior objective swallow measures Goal status: ONGOING  2.  Pt will carryover dysarthria strategies in unstructured conversations x2 given rare min A  Baseline: reduced volume & articulatory precision  Goal status: ONGOING  3.  Pt will ID appropriate cognitive strategies to support cognitive functioning for encountered situations at home with rare min A Baseline: not compensating Goal status: ONGOING  4.  Pt will subjectively report improved functioning via PROMs by 2 points each by LTG  Baseline: EAT-10: 22, CPIB: 9, and MMQ: 51  Goal status: ONGOING   ASSESSMENT:  CLINICAL IMPRESSION: Patient is a 43 y.o. M who was seen today for ST tx for Multiple Sclerosis (MS). Dx with MS within last year. New onset dysarthria resulting in slowed rate and lower volume impacting communication effectiveness. Reported some word retrieval difficulty with benefit of extra processing time. Cognitive changes reported including  inability to recall daughter's name, misplacing commonly used items, and forgetting information in conversation. Difficulty swallowing solids indicated with need for frequent liquid washes. C/o coughing at meals 1-2x/day. OME revealed decreased lingual coordination. Pt reported anterior spillage while eating/drinking. Not currently avoiding any foods. . Recommended coughing if sensation evident, sitting upright during meals, slow rate, and frequent liquid washes. Given change in baseline functioning, pt would benefit from skilled ST intervention to optimize safety  during swallowing, communication effectiveness, and functional independence.   OBJECTIVE IMPAIRMENTS: include attention, memory, executive functioning, dysarthria, and dysphagia. These impairments are limiting patient from household responsibilities, ADLs/IADLs, effectively communicating at home and in community, and safety when swallowing. Factors affecting potential to achieve goals and functional outcome are co-morbidities. Patient will benefit from skilled SLP services to address above impairments and improve overall function.  REHAB POTENTIAL: Good  PLAN:  SLP FREQUENCY: 1-2x/week  SLP DURATION: 8 weeks  PLANNED INTERVENTIONS: Aspiration precaution training, Pharyngeal strengthening exercises, Diet toleration management , Language facilitation, Environmental controls, Cueing hierachy, Cognitive reorganization, Internal/external aids, Functional tasks, Multimodal communication approach, SLP instruction and feedback, Compensatory strategies, Patient/family education, [id number] Treatment of speech (30 or 45 min) , and 07473 Treatment of swallowing function  Check all possible CPT codes: 92507 - SLP treatment and 07473 - Swallowing treatment    Check all conditions that are expected to impact treatment: Neurological condition and/or seizures   If treatment provided at initial evaluation, no treatment charged due to lack of authorization.    Comer LILLETTE Louder, CCC-SLP 05/30/2024, 11:34 AM

## 2024-05-30 ENCOUNTER — Ambulatory Visit: Attending: Diagnostic Neuroimaging | Admitting: Occupational Therapy

## 2024-05-30 ENCOUNTER — Encounter: Payer: Self-pay | Admitting: Occupational Therapy

## 2024-05-30 ENCOUNTER — Ambulatory Visit

## 2024-05-30 DIAGNOSIS — R471 Dysarthria and anarthria: Secondary | ICD-10-CM | POA: Insufficient documentation

## 2024-05-30 DIAGNOSIS — R2681 Unsteadiness on feet: Secondary | ICD-10-CM | POA: Diagnosis present

## 2024-05-30 DIAGNOSIS — R41841 Cognitive communication deficit: Secondary | ICD-10-CM | POA: Insufficient documentation

## 2024-05-30 DIAGNOSIS — R278 Other lack of coordination: Secondary | ICD-10-CM | POA: Insufficient documentation

## 2024-05-30 DIAGNOSIS — R41842 Visuospatial deficit: Secondary | ICD-10-CM | POA: Insufficient documentation

## 2024-05-30 DIAGNOSIS — M6281 Muscle weakness (generalized): Secondary | ICD-10-CM | POA: Insufficient documentation

## 2024-05-30 DIAGNOSIS — R1310 Dysphagia, unspecified: Secondary | ICD-10-CM | POA: Diagnosis present

## 2024-05-30 DIAGNOSIS — R41840 Attention and concentration deficit: Secondary | ICD-10-CM | POA: Diagnosis present

## 2024-05-30 NOTE — Patient Instructions (Signed)
 SLOW LOUD OVER-ENNUNCIATE PAUSE  PA TA KA  [place]  BUTTERCUP  CATERPILLAR  BASEBALLL PLAYER  [place]   [place]  SLOW AND BIG - EXAGGERATE YOUR MOUTH, MAKE EACH CONSONANT  Memory Compensation Strategies  Use "WARM" strategy. W= write it down A=  associate it R=  repeat it M=  make a mental picture  You can keep a Memory Notebook. Use a 3-ring notebook with sections for the following:  calendar, important names and phone numbers, medications, doctors' names/phone numbers, "to do list"/reminders, and a section to journal what you did each day  Use a calendar to write appointments down.  Write yourself a schedule for the day.  This can be placed on the calendar or in a separate section of the Memory Notebook.  Keeping a regular schedule can help memory.  Use medication organizer with sections for each day or morning/evening pills  You may need help loading it  Keep a basket, or pegboard by the door.   Place items that you need to take out with you in the basket or on the pegboard.  You may also want to include a message board for reminders.  Use sticky notes. Place sticky notes with reminders in a place where the task is performed.  For example:  "turn off the stove" placed by the stove, "lock the door" placed on the door at eye level, "take your medications" on the bathroom mirror or by the place where you normally take your medications  Use alarms, timers, and/or a reminder app. Use while cooking to remind yourself to check on food or as a reminder to take your medicine, or as a reminder to make a call, or as a reminder to perform another task, etc.  Use a voice recorder app or small tape recorder to record important information and notes for yourself. Go back at the end of the day and listen to these.

## 2024-05-30 NOTE — Congregational Nurse Program (Signed)
  Dept: [phone]   Congregational Nurse Program Note  Date of Encounter: 05/29/2024  Telephone call to resident to verify if he received medications for pain and muscle relaxant that were called in at clinic visit last week. Stated that he had a friend take him to pick up the medication.  Hercules to PCP today and stated medication was not changed and back pain not totally relieved with the pain medication. Past Medical History: Past Medical History:  Diagnosis Date   Asthma    Back pain    Coordination impairment    Falls frequently    Impaired gait and mobility    Slow rate of speech    Stroke [facility])    2004   Vision blurred     Encounter Details:  Community Questionnaire - 05/29/24 1640       Questionnaire   Ask client: Do you give verbal consent for me to treat you today? Yes    Student Assistance N/A    [place]    Encounter Setting Phone/Text/Email    Population Status Unknown   Has own apartment at [facility] [employer] [facility]    Insurance/Financial Assistance Referral N/A    Medication Patient Medications Reviewed;Have Medication Insecurities    Medical Provider Yes    Screening Referrals Made N/A    Medical Referrals Made N/A    Medical Appointment Completed Non-[facility] PCP/Clinic    CNP Interventions Advocate/Support;Counsel;Educate    Screenings CN Performed N/A    ED Visit Averted N/A    Life-Saving Intervention Made N/A      Questionnaire   Housing/Utilities N/A
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
## 2024-05-30 NOTE — Patient Instructions (Addendum)
    FOR LT HAND:   1. Grip Strengthening (Resistive Putty)   Squeeze putty using thumb and all fingers. Repeat _20___ times. Do __2__ sessions per day.   2. Roll putty into tube on table and pinch between first two fingers and thumb x 10 reps. Do 2 sessions per day           Coordination Activities  Perform the following activities for 15 minutes 1-2 times per day with both hand(s).  Rotate ball in fingertips (clockwise and counter-clockwise).  Flip cards 1 at a time as fast as you can.  Deal cards with your thumb (Hold deck in hand and push card off top with thumb).  Pick up coins and place in container or coin bank.  Pick up coins one at a time until you get 5 in your hand, then move coins from palm to fingertips to stack one at a time.  Twirl pen between fingers.  Screw together nuts and bolts, then unfasten.

## 2024-05-31 ENCOUNTER — Telehealth: Payer: Self-pay | Admitting: Neurology
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
## 2024-06-04 ENCOUNTER — Other Ambulatory Visit: Payer: Self-pay | Admitting: Neurology

## 2024-06-04 MED ORDER — OCRELIZUMAB 300 MG/10ML IV SOLN: 600.0000 mg | INTRAVENOUS | 1 refills | Status: AC

## 2024-06-05 NOTE — Therapy (Unsigned)
 OUTPATIENT SPEECH LANGUAGE PATHOLOGYTREATMENT   Patient Name: Martin Barrett MRN: [id number] DOB:02-13-1981, 43 y.o., male Today's Date: 06/06/2024  PCP: Benjamine Aland, MD REFERRING PROVIDER: Margaret Eduard SAUNDERS, MD  END OF SESSION:  End of Session - 06/06/24 1150     Visit Number 3    Number of Visits 9    Date for SLP Re-Evaluation 07/11/24    Authorization Type Medicaid [employer] [place] -auth requested    SLP Start Time 1150   late arrival   SLP Stop Time  1230    SLP Time Calculation (min) 40 min    Activity Tolerance Patient tolerated treatment well            Past Medical History:  Diagnosis Date   Asthma    Back pain    Coordination impairment    Falls frequently    Impaired gait and mobility    Slow rate of speech    Stroke [facility])    2004   Vision blurred    Past Surgical History:  Procedure Laterality Date   FACIAL COSMETIC SURGERY     HERNIA REPAIR     Patient Active Problem List   Diagnosis Date Noted   Bed bug bite 12/14/2018    ONSET DATE: 04/09/2024 (referral date)   REFERRING DIAG: G35 (ICD-10-CM) - Multiple sclerosis, relapsing-remitting  THERAPY DIAG:  Cognitive communication deficit  Dysarthria and anarthria  Dysphagia, unspecified type  Rationale for Evaluation and Treatment: Rehabilitation  SUBJECTIVE:   SUBJECTIVE STATEMENT: not a great day per nursing aid who dropped pt off  Pt accompanied by: self  PERTINENT HISTORY: MS (diagnosed about 1 year ago), asthma, stroke, back pain  PAIN: Are you having pain? Yes: NPRS scale: 8/10 Pain location: lower back Pain description: chronic, shooting pains Aggravating factors: unknown Relieving factors: pain medication, pressure  FALLS: Has patient fallen in last 6 months?  Yes, See PT evaluation for details  LIVING ENVIRONMENT: Lives with: lives alone Lives in: House/apartment  PLOF:  Level of assistance: Independent with ADLs, Independent with IADLs Employment: Chef  PATIENT  GOALS: optimize return to baseline   OBJECTIVE:  Note: Objective measures were completed at Evaluation unless otherwise noted.  COGNITION: Overall cognitive status: Impaired Areas of impairment:  Attention: Impaired: Alternating, Divided Memory: Impaired: Working, Teacher, music term, Prospective Executive function: Impaired: Problem solving, Error awareness, and Slow processing Functional deficits: Endorsed difficulty recalling family members name, misplacing commonly used items, and overall delayed processing   AUDITORY COMPREHENSION: Overall auditory comprehension: Appears intact  READING COMPREHENSION: Impacted by visual deficits   EXPRESSION: verbal  VERBAL EXPRESSION: Level of generative/spontaneous verbalization: conversation Automatic speech: name: intact and social response: intact  Interfering components: speech intelligibility and processing speed Effective technique: extra processing time  Comments: Indicated word retrieval difficulty with no overt episodes appreciated today. Endorsed compensating with extra processing time   WRITTEN EXPRESSION: Dominant hand: left Written expression: Impaired per patient report   MOTOR SPEECH: Overall motor speech: impaired Level of impairment: Conversation Respiration: thoracic breathing and clavicular breathing Phonation: breathy, hoarse, and low vocal intensity Resonance: hyponasality Articulation: Impaired: conversation Intelligibility: Intelligibility reduced (mildly) Motor planning: Impaired: inconsistent Interfering components: MS Effective technique: slow rate, increased vocal intensity, over articulate, and pacing  ORAL MOTOR EXAMINATION: Overall status: Impaired:   Lingual: Bilateral (Strength and Coordination) Comments: 4 seconds for sustained /a/   CLINICAL SWALLOW ASSESSMENT:   Current diet: regular and thin liquids Dentition: adequate natural dentition Patient directly observed with POs: Yes: regular and thin  liquids  Feeding: able to feed self (reportedly drops some food) Liquids provided by: cup [place] Protocol: Passed with no overt s/sx of aspiration  Oral phase signs and symptoms: none Pharyngeal phase signs and symptoms: complaints of residue Comments: Endorsed sensation of need to cough x1 but did not cough d/t woman present. Encouraged strong cough with rationale for airway protection; able to demo seemingly adequate cough strength. Belched x1   Patient reports: Endorsed need for frequent liquid washes; frequent choking/coughing at meals 1-2x/day   PATIENT REPORTED OUTCOME MEASURES (PROM): EAT-10: 22, Communication Participation Item Bank (CPIB): 9, and Memory Mistake Questionnaire (MMQ): 51                                                                                                                             TREATMENT DATE:  06/06/24: Denied any recent falls. Now has nursing aid within last week. Endorsed dysphagia is the same. Re-educated recommended swallow recommendations, with pt verbalizing understanding. Reported increase saliva, with SLP recommending intentional swallows and neutral head positioning. Reviewed previously instructed cognitive compensations. Did not require use at home. Targeted communication and recall of personal information. Pt was 100% intelligible this session with use of slow rate and chunking information into smaller amounts with occasional min A.Homework: start an audiobook and ask nursing aid to assist with setting up air fryer.    05/30/24: Reported swallowing has been okay. Self-implementing chin tuck independently. Recommended trialing neural head positioning, slow rate, small bites, and liquid washes. Pt verbalized understanding. Initiated education and instruction of dysarthria and cognitive compensations, with handouts provided. Assisted with setting up notepad app and audiobook app on phone. Instructed adding previously forgotten information into notepad  app to support recall. Demonstrated how to utilize associations, repetition, and visualization to aid recall. Pt able to demo with rare min A.  05/16/24: eval only   PATIENT EDUCATION: Education details: POC Person educated: Patient Education method: Explanation Education comprehension: verbalized understanding and needs further education   GOALS: Goals reviewed with patient? Yes  SHORT TERM GOALS: Target date: 06/13/2024  Pt will carryover recommended swallow precautions with no overt s/sx of aspiration exhibited given rare min A  Baseline: reported s/sx of aspiration  Goal status: ONGOING  2.  Pt will demonstrate dysarthria strategies during structured conversations x2 given occasional min A  Baseline: reduced volume & articulatory precision; 06/06/24 Goal status: ONGOING  3.  Pt will implement cognitive strategies to optimize recall of personally relevant information (I.e., children's names, household items) > 1 week  Baseline: memory mistakes  Goal status: MET    LONG TERM GOALS: Target date: 07/11/2024  Pt will complete objective swallow study if warranted  Baseline: no prior objective swallow measures Goal status: ONGOING  2.  Pt will carryover dysarthria strategies in unstructured conversations x2 given rare min A  Baseline: reduced volume & articulatory precision  Goal status: ONGOING  3.  Pt will ID appropriate cognitive strategies to support cognitive functioning  for encountered situations at home with rare min A Baseline: not compensating Goal status: ONGOING  4.  Pt will subjectively report improved functioning via PROMs by 2 points each by LTG  Baseline: EAT-10: 22, CPIB: 9, and MMQ: 51  Goal status: ONGOING   ASSESSMENT:  CLINICAL IMPRESSION: Patient is a 43 y.o. M who was seen today for ST tx for Multiple Sclerosis (MS). Dx with MS within last year. New onset dysarthria resulting in slowed rate and lower volume impacting communication effectiveness.  Reported some word retrieval difficulty with benefit of extra processing time. Cognitive changes reported including inability to recall daughter's name, misplacing commonly used items, and forgetting information in conversation. Difficulty swallowing solids indicated with need for frequent liquid washes. C/o coughing at meals 1-2x/day. OME revealed decreased lingual coordination. Pt reported anterior spillage while eating/drinking. Not currently avoiding any foods. Recommended coughing if sensation evident, sitting upright during meals, slow rate, and frequent liquid washes. Given change in baseline functioning, pt would benefit from skilled ST intervention to optimize safety during swallowing, communication effectiveness, and functional independence.   OBJECTIVE IMPAIRMENTS: include attention, memory, executive functioning, dysarthria, and dysphagia. These impairments are limiting patient from household responsibilities, ADLs/IADLs, effectively communicating at home and in community, and safety when swallowing. Factors affecting potential to achieve goals and functional outcome are co-morbidities. Patient will benefit from skilled SLP services to address above impairments and improve overall function.  REHAB POTENTIAL: Good  PLAN:  SLP FREQUENCY: 1-2x/week  SLP DURATION: 8 weeks  PLANNED INTERVENTIONS: Aspiration precaution training, Pharyngeal strengthening exercises, Diet toleration management , Language facilitation, Environmental controls, Cueing hierachy, Cognitive reorganization, Internal/external aids, Functional tasks, Multimodal communication approach, SLP instruction and feedback, Compensatory strategies, Patient/family education, [phone] Treatment of speech (30 or 45 min) , and 07473 Treatment of swallowing function  Check all possible CPT codes: 92507 - SLP treatment and 07473 - Swallowing treatment    Check all conditions that are expected to impact treatment: Neurological condition  and/or seizures   If treatment provided at initial evaluation, no treatment charged due to lack of authorization.    Comer LILLETTE Louder, CCC-SLP 06/06/2024, 11:50 AM

## 2024-06-06 ENCOUNTER — Ambulatory Visit

## 2024-06-06 ENCOUNTER — Encounter: Payer: Self-pay | Admitting: Occupational Therapy

## 2024-06-06 ENCOUNTER — Ambulatory Visit: Admitting: Occupational Therapy

## 2024-06-06 DIAGNOSIS — M6281 Muscle weakness (generalized): Secondary | ICD-10-CM | POA: Diagnosis not present

## 2024-06-06 DIAGNOSIS — R41841 Cognitive communication deficit: Secondary | ICD-10-CM

## 2024-06-06 DIAGNOSIS — R41842 Visuospatial deficit: Secondary | ICD-10-CM

## 2024-06-06 DIAGNOSIS — R278 Other lack of coordination: Secondary | ICD-10-CM

## 2024-06-06 DIAGNOSIS — R471 Dysarthria and anarthria: Secondary | ICD-10-CM

## 2024-06-06 DIAGNOSIS — R41840 Attention and concentration deficit: Secondary | ICD-10-CM

## 2024-06-06 DIAGNOSIS — R2681 Unsteadiness on feet: Secondary | ICD-10-CM

## 2024-06-06 DIAGNOSIS — R1310 Dysphagia, unspecified: Secondary | ICD-10-CM
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
## 2024-06-12 ENCOUNTER — Other Ambulatory Visit: Payer: Self-pay

## 2024-06-12 ENCOUNTER — Other Ambulatory Visit (HOSPITAL_COMMUNITY): Payer: Self-pay

## 2024-06-12 NOTE — Congregational Nurse Program (Signed)
  Dept: [phone]   Congregational Nurse Program Note  Date of Encounter: 06/12/2024  Telephone call to resident per request of [facility] [place] case Production designer, theatre/television/film. Resident States he is not feeling well today, more weakness and is almost out of pain medication.Reviewed all medications, called pharmacy ordered refill of pain medication.  Case manager will pick up the medication in AM, resident notified. Past Medical History: Past Medical History:  Diagnosis Date   Asthma    Back pain    Coordination impairment    Falls frequently    Impaired gait and mobility    Slow rate of speech    Stroke [facility])    2004   Vision blurred     Encounter Details:  Community Questionnaire - 06/12/24 1422       Questionnaire   Ask client: Do you give verbal consent for me to treat you today? Yes    Student Assistance N/A    [place] [facility]    Encounter Setting Phone/Text/Email    Population Status Unknown   Has own apartment at [facility]    Insurance/Financial Assistance Referral N/A    Medication Patient Medications Reviewed;Have Medication Insecurities    Medical Provider Yes    Screening Referrals Made N/A    Medical Referrals Made N/A    Medical Appointment Completed N/A    CNP Interventions Advocate/Support;Counsel;Educate;Case Management    Screenings CN Performed N/A    ED Visit Averted N/A    Life-Saving Intervention Made N/A      Questionnaire   Housing/Utilities N/A

## 2024-06-13 ENCOUNTER — Encounter: Payer: Self-pay | Admitting: Occupational Therapy

## 2024-06-13 ENCOUNTER — Ambulatory Visit: Admitting: Occupational Therapy

## 2024-06-13 ENCOUNTER — Encounter: Payer: Self-pay | Admitting: Speech Pathology

## 2024-06-13 ENCOUNTER — Ambulatory Visit: Admitting: Speech Pathology

## 2024-06-13 DIAGNOSIS — R471 Dysarthria and anarthria: Secondary | ICD-10-CM

## 2024-06-13 DIAGNOSIS — R2681 Unsteadiness on feet: Secondary | ICD-10-CM

## 2024-06-13 DIAGNOSIS — M6281 Muscle weakness (generalized): Secondary | ICD-10-CM | POA: Diagnosis not present

## 2024-06-13 DIAGNOSIS — R278 Other lack of coordination: Secondary | ICD-10-CM

## 2024-06-13 DIAGNOSIS — R1310 Dysphagia, unspecified: Secondary | ICD-10-CM

## 2024-06-13 DIAGNOSIS — R41842 Visuospatial deficit: Secondary | ICD-10-CM

## 2024-06-13 DIAGNOSIS — R41840 Attention and concentration deficit: Secondary | ICD-10-CM

## 2024-06-13 DIAGNOSIS — R41841 Cognitive communication deficit: Secondary | ICD-10-CM

## 2024-06-13 NOTE — Patient Instructions (Signed)
  Safety considerations for loss of sensation:   Look at affected hand when using it!   Do NOT use affected arm for anything: sharp, hot, breakable, or too heavy  Always check temperature of water (for showering, washing dishes, etc) with body part not affected or let aide check temperature  Avoid cold temperatures as well (wear glove in cold temperatures)  AVOID handling chemicals and machinery    Do NOT use stove or big oven - only air fryer and microwave   (Clinic) Retraction: Row - Bilateral (Pulley)    Facing pulley, arms reaching forward, pull hands toward stomach, pinching shoulder blades together. Repeat _10___ times per set. Do __2__ sets per day, every other day   Resisted Horizontal Abduction: Bilateral   Sit or stand, tubing in both hands, arms out in front. Keeping arms straight, pinch shoulder blades together and stretch arms out. Repeat _10___ times per set. Do _1-2___ sessions per day, every other day.
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
## 2024-06-13 NOTE — Therapy (Signed)
 OUTPATIENT SPEECH LANGUAGE PATHOLOGYTREATMENT   Patient Name: Martin Barrett MRN: [id number] DOB:[birth year], 43 y.o., male Today's Date: 06/13/2024  PCP: Benjamine Aland, MD REFERRING PROVIDER: Margaret Eduard SAUNDERS, MD  END OF SESSION:  End of Session - 06/13/24 1317     Visit Number 4    Number of Visits 9    Date for SLP Re-Evaluation 07/11/24    Authorization Type [employer] -auth requested    SLP Start Time 1317    SLP Stop Time  1400    SLP Time Calculation (min) 43 min    Activity Tolerance Patient tolerated treatment well            Past Medical History:  Diagnosis Date   Asthma    Back pain    Coordination impairment    Falls frequently    Impaired gait and mobility    Slow rate of speech    Stroke (HCC)    2004   Vision blurred    Past Surgical History:  Procedure Laterality Date   FACIAL COSMETIC SURGERY     HERNIA REPAIR     Patient Active Problem List   Diagnosis Date Noted   Bed bug bite 12/14/2018    ONSET DATE: 04/09/2024 (referral date)   REFERRING DIAG: G35 (ICD-10-CM) - Multiple sclerosis, relapsing-remitting  THERAPY DIAG:  Dysarthria and anarthria  Dysphagia, unspecified type  Cognitive communication deficit  Rationale for Evaluation and Treatment: Rehabilitation  SUBJECTIVE:   SUBJECTIVE STATEMENT: not a great day per nursing aid who dropped pt off  Pt accompanied by: self  PERTINENT HISTORY: MS (diagnosed about 1 year ago), asthma, stroke, back pain  PAIN: Are you having pain? Yes: NPRS scale: 8/10 Pain location: lower back Pain description: chronic, shooting pains Aggravating factors: unknown Relieving factors: pain medication, pressure  FALLS: Has patient fallen in last 6 months?  Yes, See PT evaluation for details  LIVING ENVIRONMENT: Lives with: lives alone Lives in: House/apartment  PLOF:  Level of assistance: Independent with ADLs, Independent with IADLs Employment: Chef  PATIENT GOALS: optimize  return to baseline   OBJECTIVE:  Note: Objective measures were completed at Evaluation unless otherwise noted.  COGNITION: Overall cognitive status: Impaired Areas of impairment:  Attention: Impaired: Alternating, Divided Memory: Impaired: Working, Teacher, music term, Prospective Executive function: Impaired: Problem solving, Error awareness, and Slow processing Functional deficits: Endorsed difficulty recalling family members name, misplacing commonly used items, and overall delayed processing   AUDITORY COMPREHENSION: Overall auditory comprehension: Appears intact  READING COMPREHENSION: Impacted by visual deficits   EXPRESSION: verbal  VERBAL EXPRESSION: Level of generative/spontaneous verbalization: conversation Automatic speech: name: intact and social response: intact  Interfering components: speech intelligibility and processing speed Effective technique: extra processing time  Comments: Indicated word retrieval difficulty with no overt episodes appreciated today. Endorsed compensating with extra processing time   WRITTEN EXPRESSION: Dominant hand: left Written expression: Impaired per patient report   MOTOR SPEECH: Overall motor speech: impaired Level of impairment: Conversation Respiration: thoracic breathing and clavicular breathing Phonation: breathy, hoarse, and low vocal intensity Resonance: hyponasality Articulation: Impaired: conversation Intelligibility: Intelligibility reduced (mildly) Motor planning: Impaired: inconsistent Interfering components: MS Effective technique: slow rate, increased vocal intensity, over articulate, and pacing  ORAL MOTOR EXAMINATION: Overall status: Impaired:   Lingual: Bilateral (Strength and Coordination) Comments: 4 seconds for sustained /a/   CLINICAL SWALLOW ASSESSMENT:   Current diet: regular and thin liquids Dentition: adequate natural dentition Patient directly observed with POs: Yes: regular and thin liquids  Feeding:  able to feed self (reportedly drops some food) Liquids provided by: cup [facility] Protocol: Passed with no overt s/sx of aspiration  Oral phase signs and symptoms: none Pharyngeal phase signs and symptoms: complaints of residue Comments: Endorsed sensation of need to cough x1 but did not cough d/t woman present. Encouraged strong cough with rationale for airway protection; able to demo seemingly adequate cough strength. Belched x1   Patient reports: Endorsed need for frequent liquid washes; frequent choking/coughing at meals 1-2x/day   PATIENT REPORTED OUTCOME MEASURES (PROM): EAT-10: 22, Communication Participation Item Bank (CPIB): 9, and Memory Mistake Questionnaire (MMQ): 51                                                                                                                             TREATMENT DATE:   06/13/24: He is successfully using organizer to recall meds - recalled children's names with mod I, however he did not recall 2 grandson's names. He verbalizes swallow strategies with rare min A to keep head neutral, swallow with intent, swallow frequently. Speech intelligible at conversation level with mod I carryover of slow rate and over articulation. In structured task generating sentences with 3 syllables words to be 100% intelligible. Voice hoarse, which does not affect intelligibility. He is successfully using air fryer to cook to avoid having to remember to turn off appliance. Sammuel reports difficulty recently communicating at a store. Generated strategy of self advocating, generated script I have MS and I have trouble talking. Please be patient. To alert listener in person or over phone to give their full attention to Joshus's speech and explain why he sound different.   06/06/24: Denied any recent falls. Now has nursing aid within last week. Endorsed dysphagia is the same. Re-educated recommended swallow recommendations, with pt verbalizing understanding. Reported increase saliva,  with SLP recommending intentional swallows and neutral head positioning. Reviewed previously instructed cognitive compensations. Did not require use at home. Targeted communication and recall of personal information. Pt was 100% intelligible this session with use of slow rate and chunking information into smaller amounts with occasional min A.Homework: start an audiobook and ask nursing aid to assist with setting up air fryer.    05/30/24: Reported swallowing has been okay. Self-implementing chin tuck independently. Recommended trialing neural head positioning, slow rate, small bites, and liquid washes. Pt verbalized understanding. Initiated education and instruction of dysarthria and cognitive compensations, with handouts provided. Assisted with setting up notepad app and audiobook app on phone. Instructed adding previously forgotten information into notepad app to support recall. Demonstrated how to utilize associations, repetition, and visualization to aid recall. Pt able to demo with rare min A.  05/16/24: eval only   PATIENT EDUCATION: Education details: POC Person educated: Patient Education method: Explanation Education comprehension: verbalized understanding and needs further education   GOALS: Goals reviewed with patient? Yes  SHORT TERM GOALS: Target date: 06/13/2024  Pt will carryover recommended swallow precautions with no overt s/sx of aspiration  exhibited given rare min A  Baseline: reported s/sx of aspiration  Goal status: ONGOING  2.  Pt will demonstrate dysarthria strategies during structured conversations x2 given occasional min A  Baseline: reduced volume & articulatory precision; 06/06/24 Goal status: ONGOING  3.  Pt will implement cognitive strategies to optimize recall of personally relevant information (I.e., children's names, household items) > 1 week  Baseline: memory mistakes  Goal status: MET    LONG TERM GOALS: Target date: 07/11/2024  Pt will complete  objective swallow study if warranted  Baseline: no prior objective swallow measures Goal status: ONGOING  2.  Pt will carryover dysarthria strategies in unstructured conversations x2 given rare min A  Baseline: reduced volume & articulatory precision  Goal status: ONGOING  3.  Pt will ID appropriate cognitive strategies to support cognitive functioning for encountered situations at home with rare min A Baseline: not compensating Goal status: ONGOING  4.  Pt will subjectively report improved functioning via PROMs by 2 points each by LTG  Baseline: EAT-10: 22, CPIB: 9, and MMQ: 51  Goal status: ONGOING   ASSESSMENT:  CLINICAL IMPRESSION: Patient is a 43 y.o. M who was seen today for ST tx for Multiple Sclerosis (MS). Dx with MS within last year. New onset dysarthria resulting in slowed rate and lower volume impacting communication effectiveness. Reported some word retrieval difficulty with benefit of extra processing time. Cognitive changes reported including inability to recall daughter's name, misplacing commonly used items, and forgetting information in conversation. Difficulty swallowing solids indicated with need for frequent liquid washes. C/o coughing at meals 1-2x/day. OME revealed decreased lingual coordination. Pt reported anterior spillage while eating/drinking. Not currently avoiding any foods. Recommended coughing if sensation evident, sitting upright during meals, slow rate, and frequent liquid washes. Given change in baseline functioning, pt would benefit from skilled ST intervention to optimize safety during swallowing, communication effectiveness, and functional independence.   OBJECTIVE IMPAIRMENTS: include attention, memory, executive functioning, dysarthria, and dysphagia. These impairments are limiting patient from household responsibilities, ADLs/IADLs, effectively communicating at home and in community, and safety when swallowing. Factors affecting potential to achieve  goals and functional outcome are co-morbidities. Patient will benefit from skilled SLP services to address above impairments and improve overall function.  REHAB POTENTIAL: Good  PLAN:  SLP FREQUENCY: 1-2x/week  SLP DURATION: 8 weeks  PLANNED INTERVENTIONS: Aspiration precaution training, Pharyngeal strengthening exercises, Diet toleration management , Language facilitation, Environmental controls, Cueing hierachy, Cognitive reorganization, Internal/external aids, Functional tasks, Multimodal communication approach, SLP instruction and feedback, Compensatory strategies, Patient/family education, [phone] Treatment of speech (30 or 45 min) , and 07473 Treatment of swallowing function  Check all possible CPT codes: 92507 - SLP treatment and 07473 - Swallowing treatment    Check all conditions that are expected to impact treatment: Neurological condition and/or seizures   If treatment provided at initial evaluation, no treatment charged due to lack of authorization.    Dennis Hegeman, Leita Caldron, CCC-SLP 06/13/2024, 2:02 PM

## 2024-06-13 NOTE — Patient Instructions (Addendum)
  Audio books - Dayla App  Grandsons' names  A good thing to do is let the person you are calling or talking to know that you have MS and have some trouble talking and ask them to be patient. This alerts your listener that they need to pay close attention to your talking

## 2024-06-14 ENCOUNTER — Other Ambulatory Visit: Payer: Self-pay

## 2024-06-14 ENCOUNTER — Other Ambulatory Visit (HOSPITAL_COMMUNITY): Payer: Self-pay

## 2024-06-15 ENCOUNTER — Other Ambulatory Visit (HOSPITAL_COMMUNITY): Payer: Self-pay

## 2024-06-15 MED ORDER — AMLODIPINE BESYLATE 5 MG PO TABS
5.0000 mg | ORAL_TABLET | Freq: Every evening | ORAL | 0 refills | Status: DC
  Filled 2024-06-15: qty 90, 90d supply, fill #0
  Filled 2024-06-19: qty 30, 30d supply, fill #0
  Filled 2024-07-19 – 2024-07-24 (×2): qty 30, 30d supply, fill #1
  Filled 2024-08-21: qty 30, 30d supply, fill #2

## 2024-06-15 MED ORDER — PANTOPRAZOLE SODIUM 20 MG PO TBEC
20.0000 mg | DELAYED_RELEASE_TABLET | Freq: Every day | ORAL | 1 refills | Status: AC
  Filled 2024-06-15: qty 90, 90d supply, fill #0
  Filled 2024-06-19: qty 30, 30d supply, fill #0
  Filled 2024-07-19 – 2024-07-24 (×2): qty 30, 30d supply, fill #1
  Filled 2024-08-21: qty 30, 30d supply, fill #2
  Filled 2024-09-17: qty 30, 30d supply, fill #3
  Filled 2024-10-15: qty 30, 30d supply, fill #4

## 2024-06-18 ENCOUNTER — Telehealth: Payer: Self-pay | Admitting: Neurology

## 2024-06-18 NOTE — Telephone Encounter (Signed)
 [facility] is looking into the home health option.

## 2024-06-18 NOTE — Telephone Encounter (Signed)
 Received a notification that pt no showed his infusion for ocrevus . When intrafusion reached out pt advised that he was unable to have transportation. I will see if home infusion is a option for him.  I spoke with [name] 12/15/1980 He stated his Aid was unable to provide transportation again. Maybe yall can try to get him approved for Home Infusions.  Called the pt and was able to confirm that he lives in a apartment by himself. I advised I would touch base with a home health company to see if they offer ocrevus  as a home infusion to prevent future doses from being missed. The patient was appreciative for this. I have sent a message to [facility] inquiring on this matter.

## 2024-06-19 ENCOUNTER — Other Ambulatory Visit: Payer: Self-pay

## 2024-06-19 ENCOUNTER — Other Ambulatory Visit (HOSPITAL_COMMUNITY): Payer: Self-pay

## 2024-06-19 NOTE — Telephone Encounter (Signed)
 Pt is scheduled for ocrevus  infusion on 07/10/2024. [facility] is still looking into whether insurance will approve the infusion at home. Will await their response.

## 2024-06-19 NOTE — Congregational Nurse Program (Signed)
  Dept: [phone]   Congregational Nurse Program Note  Date of Encounter: 06/19/2024  Telephone request from resident to have a new shower chair and reschedule missed MD office visit, new appointment 06/28/24. Referral nurse at [facility] will order a transfer shower chair.  Resident also requested refill of muscle relaxant, called pharmacy for refill to be picked up by [facility] case Production designer, theatre/television/film tomorrow.   Past Medical History: Past Medical History:  Diagnosis Date   Asthma    Back pain    Coordination impairment    Falls frequently    Impaired gait and mobility    Slow rate of speech    Stroke [facility])    2004   Vision blurred     Encounter Details:  Community Questionnaire - 06/19/24 1525       Questionnaire   Ask client: Do you give verbal consent for me to treat you today? Yes    Student Assistance N/A    [place]    Encounter Setting Phone/Text/Email    Population Status Unknown   Has own apartment at [facility]    Insurance/Financial Assistance Referral N/A    Medication Patient Medications Reviewed;Have Medication Insecurities;Provided Medication Assistance    Medical Provider Yes    Screening Referrals Made N/A    Medical Referrals Made Non-[facility] PCP/Clinic    Medical Appointment Completed N/A    CNP Interventions Advocate/Support;Counsel;Educate;Case Management;Navigate Healthcare System    Screenings CN Performed N/A    ED Visit Averted N/A    Life-Saving Intervention Made N/A      Questionnaire   Housing/Utilities N/A

## 2024-06-20 ENCOUNTER — Encounter: Admitting: Occupational Therapy

## 2024-06-20 ENCOUNTER — Ambulatory Visit: Admitting: Speech Pathology

## 2024-06-20 DIAGNOSIS — R41841 Cognitive communication deficit: Secondary | ICD-10-CM

## 2024-06-20 DIAGNOSIS — R471 Dysarthria and anarthria: Secondary | ICD-10-CM

## 2024-06-20 DIAGNOSIS — M6281 Muscle weakness (generalized): Secondary | ICD-10-CM | POA: Diagnosis not present

## 2024-06-20 NOTE — Patient Instructions (Addendum)
   Crystal:  Help Martin Barrett keep track of  his appointments on the fridge - help him write them down  He says he has an appointment for an injection here, but I don't see it in the computer - he will need to call or check at the desk  Help him set up and learn to use the air fryer  Encourage him to be mindful about swallowing when he is eating - limit distractions and conversation - he needs to focus on chewing and swallowing  Plan ahead for food on the weekend that just has to be microwaved   Help him write down and bring questions for his doctors  He has a question about his eye not moving as well as having to appear in court or do a Zoom  You have used 19 therapy visits so far this  [place] job remembering the script I have MS so I have trouble talking and need extra time to respond   Volume and slow speech   You may want to add that the MS has affected your eye as well  You are doing great using shorter sentences so that you can keep your volume up

## 2024-06-20 NOTE — Therapy (Signed)
 OUTPATIENT SPEECH LANGUAGE PATHOLOGY TREATMENT & DISCHARGE SUMMARY   Patient Name: Martin Barrett MRN: [id number] DOB:May 18, 1981, 43 y.o., male Today's Date: 06/20/2024  PCP: Benjamine Aland, MD REFERRING PROVIDER: Margaret Eduard SAUNDERS, MD  END OF SESSION:  End of Session - 06/20/24 1541     Visit Number 5    Number of Visits 9    Date for SLP Re-Evaluation 07/11/24    Authorization Type Medicaid [place] -auth requested    SLP Start Time 1530    SLP Stop Time  1615    SLP Time Calculation (min) 45 min    Activity Tolerance Patient tolerated treatment well            Past Medical History:  Diagnosis Date   Asthma    Back pain    Coordination impairment    Falls frequently    Impaired gait and mobility    Slow rate of speech    Stroke [facility])    2004   Vision blurred    Past Surgical History:  Procedure Laterality Date   FACIAL COSMETIC SURGERY     HERNIA REPAIR     Patient Active Problem List   Diagnosis Date Noted   Bed bug bite 12/14/2018    ONSET DATE: 04/09/2024 (referral date)   REFERRING DIAG: G35 (ICD-10-CM) - Multiple sclerosis, relapsing-remitting  THERAPY DIAG:  Dysarthria and anarthria  Cognitive communication deficit  Rationale for Evaluation and Treatment: Rehabilitation  SUBJECTIVE:   SUBJECTIVE STATEMENT: not a great day per nursing aid who dropped pt off  Pt accompanied by: self  PERTINENT HISTORY: MS (diagnosed about 1 year ago), asthma, stroke, back pain  PAIN: Are you having pain? Yes: NPRS scale: 8/10 Pain location: lower back Pain description: chronic, shooting pains Aggravating factors: unknown Relieving factors: pain medication, pressure  FALLS: Has patient fallen in last 6 months?  Yes, See PT evaluation for details  LIVING ENVIRONMENT: Lives with: lives alone Lives in: House/apartment  PLOF:  Level of assistance: Independent with ADLs, Independent with IADLs Employment: Chef  PATIENT GOALS: optimize return to  baseline   OBJECTIVE:  Note: Objective measures were completed at Evaluation unless otherwise noted.  COGNITION: Overall cognitive status: Impaired Areas of impairment:  Attention: Impaired: Alternating, Divided Memory: Impaired: Working, Teacher, music term, Prospective Executive function: Impaired: Problem solving, Error awareness, and Slow processing Functional deficits: Endorsed difficulty recalling family members name, misplacing commonly used items, and overall delayed processing   AUDITORY COMPREHENSION: Overall auditory comprehension: Appears intact  READING COMPREHENSION: Impacted by visual deficits   EXPRESSION: verbal  VERBAL EXPRESSION: Level of generative/spontaneous verbalization: conversation Automatic speech: name: intact and social response: intact  Interfering components: speech intelligibility and processing speed Effective technique: extra processing time  Comments: Indicated word retrieval difficulty with no overt episodes appreciated today. Endorsed compensating with extra processing time   WRITTEN EXPRESSION: Dominant hand: left Written expression: Impaired per patient report   MOTOR SPEECH: Overall motor speech: impaired Level of impairment: Conversation Respiration: thoracic breathing and clavicular breathing Phonation: breathy, hoarse, and low vocal intensity Resonance: hyponasality Articulation: Impaired: conversation Intelligibility: Intelligibility reduced (mildly) Motor planning: Impaired: inconsistent Interfering components: MS Effective technique: slow rate, increased vocal intensity, over articulate, and pacing  ORAL MOTOR EXAMINATION: Overall status: Impaired:   Lingual: Bilateral (Strength and Coordination) Comments: 4 seconds for sustained /a/   CLINICAL SWALLOW ASSESSMENT:   Current diet: regular and thin liquids Dentition: adequate natural dentition Patient directly observed with POs: Yes: regular and thin liquids  Feeding:  able to feed  self (reportedly drops some food) Liquids provided by: cup [place] Protocol: Passed with no overt s/sx of aspiration  Oral phase signs and symptoms: none Pharyngeal phase signs and symptoms: complaints of residue Comments: Endorsed sensation of need to cough x1 but did not cough d/t woman present. Encouraged strong cough with rationale for airway protection; able to demo seemingly adequate cough strength. [place] x1   Patient reports: Endorsed need for frequent liquid washes; frequent choking/coughing at meals 1-2x/day   PATIENT REPORTED OUTCOME MEASURES (PROM): EAT-10: 22, Communication Participation Item Bank (CPIB): 9, and Memory Mistake Questionnaire (MMQ): 51                                                                                                                             TREATMENT DATE:    06/20/24: Organizer not present - he recalled 1 grandson name Therapist, occupational) and all of his children's names with mod I. He is using his refrigerator to post appointment times. Generated strategy of having CNA help him write down appointments, write down questions for his doctor and pre-planning for food on the weekend or when CNA is off for foods he can microwave. Reviewed swallow strategies of keeping head neutral, swallowing with intent and limiting distractions with meals. He is carrying over strategies of slow rate and strong volume to be intelligible 100% today. His score in the Communication Participation Item Bank improved from 9 to 16, due to carryover of compensatory strategies for dysarthria  06/13/24: He is successfully using organizer to recall meds - recalled children's names with mod I, however he did not recall 2 grandson's names. He verbalizes swallow strategies with rare min A to keep head neutral, swallow with intent, swallow frequently. Speech intelligible at conversation level with mod I carryover of slow rate and over articulation. In structured task generating sentences with 3 syllables  words to be 100% intelligible. Voice hoarse, which does not affect intelligibility. He is successfully using air fryer to cook to avoid having to remember to turn off appliance. Tracer reports difficulty recently communicating at a store. Generated strategy of self advocating, generated script I have MS and I have trouble talking. Please be patient. To alert listener in person or over phone to give their full attention to Taber's speech and explain why he sound different.   06/06/24: Denied any recent falls. Now has nursing aid within last week. Endorsed dysphagia is the same. Re-educated recommended swallow recommendations, with pt verbalizing understanding. Reported increase saliva, with SLP recommending intentional swallows and neutral head positioning. Reviewed previously instructed cognitive compensations. Did not require use at home. Targeted communication and recall of personal information. Pt was 100% intelligible this session with use of slow rate and chunking information into smaller amounts with occasional min A.Homework: start an audiobook and ask nursing aid to assist with setting up air fryer.    05/30/24: Reported swallowing has been okay. Self-implementing chin tuck independently. Recommended trialing neural head  positioning, slow rate, small bites, and liquid washes. Pt verbalized understanding. Initiated education and instruction of dysarthria and cognitive compensations, with handouts provided. Assisted with setting up notepad app and audiobook app on phone. Instructed adding previously forgotten information into notepad app to support recall. Demonstrated how to utilize associations, repetition, and visualization to aid recall. Pt able to demo with rare min A.  05/16/24: eval only   PATIENT EDUCATION: Education details: POC Person educated: Patient Education method: Explanation Education comprehension: verbalized understanding and needs further education   GOALS: Goals reviewed  with patient? Yes  SHORT TERM GOALS: Target date: 06/13/2024  Pt will carryover recommended swallow precautions with no overt s/sx of aspiration exhibited given rare min A  Baseline: reported s/sx of aspiration  Goal status: MET  2.  Pt will demonstrate dysarthria strategies during structured conversations x2 given occasional min A  Baseline: reduced volume & articulatory precision; 06/06/24 Goal status: MET  3.  Pt will implement cognitive strategies to optimize recall of personally relevant information (I.e., children's names, household items) > 1 week  Baseline: memory mistakes  Goal status: MET    LONG TERM GOALS: Target date: 07/11/2024  Pt will complete objective swallow study if warranted  Baseline: no prior objective swallow measures Goal status: DEFERRED  2.  Pt will carryover dysarthria strategies in unstructured conversations x2 given rare min A  Baseline: reduced volume & articulatory precision  Goal status: MET  3.  Pt will ID appropriate cognitive strategies to support cognitive functioning for encountered situations at home with rare min A Baseline: not compensating Goal status: MET  4.  Pt will subjectively report improved functioning via PROMs by 2 points each by LTG  Baseline:  CPIB: 9 PRE; 16 post Goal status: MET   ASSESSMENT:  CLINICAL IMPRESSION: Patient is a 43 y.o. M who was seen today for ST tx for Multiple Sclerosis (MS). Dx with MS within last year. New onset dysarthria resulting in slowed rate and lower volume impacting communication effectiveness. Casey has demonstrated excellent carryover of compensatory strategies for dysarthria including increased volume, shorter utterances to support volume and slow rate. He also is using a script to alert others that he has difficulty talking due to MS prior to conversing with them. He is carrying over swallow precautions with mod I. Cognitive supports are in place - external reminders on fridge, having CNA help him  write down appointments and questions for health care providers. He also uses strategy of self advocating I need a minute to process this when he needs extra processing time.  Goals met, d/c ST at this time. He is in agreement  OBJECTIVE IMPAIRMENTS: include attention, memory, executive functioning, dysarthria, and dysphagia. These impairments are limiting patient from household responsibilities, ADLs/IADLs, effectively communicating at home and in community, and safety when swallowing. Factors affecting potential to achieve goals and functional outcome are co-morbidities. Patient will benefit from skilled SLP services to address above impairments and improve overall function.  REHAB POTENTIAL: Good  PLAN:  SLP FREQUENCY: 1-2x/week  SLP DURATION: 8 weeks  PLANNED INTERVENTIONS: Aspiration precaution training, Pharyngeal strengthening exercises, Diet toleration management , Language facilitation, Environmental controls, Cueing hierachy, Cognitive reorganization, Internal/external aids, Functional tasks, Multimodal communication approach, SLP instruction and feedback, Compensatory strategies, Patient/family education, [phone] Treatment of speech (30 or 45 min) , and 07473 Treatment of swallowing function  Check all possible CPT codes: 92507 - SLP treatment and 07473 - Swallowing treatment    Check all conditions that are expected to  impact treatment: Neurological condition and/or seizures   If treatment provided at initial evaluation, no treatment charged due to lack of authorization.   SPEECH THERAPY DISCHARGE SUMMARY  Visits from Start of Care: 5  Current functional level related to goals / functional outcomes: See goals above   Remaining deficits: Dysarthria, dysphagia, cognitive impairments   Education / Equipment: HEP, swallow precautions, compensations for dysarthria   Patient agrees to discharge. Patient goals were met. Patient is being discharged due to meeting the stated  rehab goals..     Felina Tello Ann, CCC-SLP 06/20/2024, 4:32 PM

## 2024-06-21 ENCOUNTER — Other Ambulatory Visit: Payer: Self-pay

## 2024-06-21 ENCOUNTER — Emergency Department (HOSPITAL_COMMUNITY)

## 2024-06-21 ENCOUNTER — Encounter (HOSPITAL_COMMUNITY): Payer: Self-pay

## 2024-06-21 ENCOUNTER — Emergency Department (HOSPITAL_COMMUNITY)
Admission: EM | Admit: 2024-06-21 | Discharge: 2024-06-21 | Disposition: A | Discharge status: Home / Self Care | Attending: Emergency Medicine | Admitting: Emergency Medicine

## 2024-06-21 DIAGNOSIS — M542 Cervicalgia: Secondary | ICD-10-CM | POA: Insufficient documentation

## 2024-06-21 DIAGNOSIS — Y9301 Activity, walking, marching and hiking: Secondary | ICD-10-CM | POA: Diagnosis not present

## 2024-06-21 DIAGNOSIS — R0789 Other chest pain: Secondary | ICD-10-CM | POA: Insufficient documentation

## 2024-06-21 DIAGNOSIS — W01198A Fall on same level from slipping, tripping and stumbling with subsequent striking against other object, initial encounter: Secondary | ICD-10-CM | POA: Insufficient documentation

## 2024-06-21 DIAGNOSIS — W19XXXA Unspecified fall, initial encounter: Secondary | ICD-10-CM

## 2024-06-21 DIAGNOSIS — R531 Weakness: Secondary | ICD-10-CM | POA: Diagnosis not present

## 2024-06-21 DIAGNOSIS — Z8673 Personal history of transient ischemic attack (TIA), and cerebral infarction without residual deficits: Secondary | ICD-10-CM | POA: Insufficient documentation

## 2024-06-21 DIAGNOSIS — M545 Low back pain, unspecified: Secondary | ICD-10-CM | POA: Diagnosis not present

## 2024-06-21 LAB — COMPREHENSIVE METABOLIC PANEL WITH GFR
ALT: 14 U/L (ref 0–44)
AST: 17 U/L (ref 15–41)
Albumin: 3.8 g/dL (ref 3.5–5.0)
Alkaline Phosphatase: 64 U/L (ref 38–126)
Anion gap: 9 (ref 5–15)
BUN: 8 mg/dL (ref 6–20)
CO2: 24 mmol/L (ref 22–32)
Calcium: 8.9 mg/dL (ref 8.9–10.3)
Chloride: 105 mmol/L (ref 98–111)
Creatinine, Ser: 1.32 mg/dL — ABNORMAL HIGH (ref 0.61–1.24)
GFR, Estimated: >60 mL/min (ref >60)
Glucose, Bld: 87 mg/dL (ref 70–99)
Potassium: 4 mmol/L (ref 3.5–5.1)
Sodium: 138 mmol/L (ref 135–145)
Total Bilirubin: 1.2 mg/dL (ref 0.0–1.2)
Total Protein: 6.7 g/dL (ref 6.5–8.1)

## 2024-06-21 LAB — CBC
HCT: 45.6 % (ref 39.0–52.0)
Hemoglobin: 15.2 g/dL (ref 13.0–17.0)
MCH: 34.2 pg — ABNORMAL HIGH (ref 26.0–34.0)
MCHC: 33.3 g/dL (ref 30.0–36.0)
MCV: 102.5 fL — ABNORMAL HIGH (ref 80.0–100.0)
Platelets: 253 K/uL (ref 150–400)
RBC: 4.45 MIL/uL (ref 4.22–5.81)
RDW: 13.9 % (ref 11.5–15.5)
WBC: 6.3 K/uL (ref 4.0–10.5)
nRBC: 0 % (ref 0.0–0.2)

## 2024-06-21 LAB — PROTIME-INR
INR: 1 (ref 0.8–1.2)
Prothrombin Time: 14.2 s (ref 11.4–15.2)

## 2024-06-21 LAB — I-STAT CHEM 8, ED
BUN: 8 mg/dL (ref 6–20)
Calcium, Ion: 1 mmol/L — ABNORMAL LOW (ref 1.15–1.40)
Chloride: 106 mmol/L (ref 98–111)
Creatinine, Ser: 1.4 mg/dL — ABNORMAL HIGH (ref 0.61–1.24)
Glucose, Bld: 82 mg/dL (ref 70–99)
HCT: 46 % (ref 39.0–52.0)
Hemoglobin: 15.6 g/dL (ref 13.0–17.0)
Potassium: 3.9 mmol/L (ref 3.5–5.1)
Sodium: 139 mmol/L (ref 135–145)
TCO2: 24 mmol/L (ref 22–32)

## 2024-06-21 LAB — URINALYSIS, ROUTINE W REFLEX MICROSCOPIC
Bilirubin Urine: NEGATIVE
Glucose, UA: NEGATIVE mg/dL
Hgb urine dipstick: NEGATIVE
Ketones, ur: NEGATIVE mg/dL
Leukocytes,Ua: NEGATIVE
Nitrite: NEGATIVE
Protein, ur: NEGATIVE mg/dL
Specific Gravity, Urine: 1.025 (ref 1.005–1.030)
pH: 5 (ref 5.0–8.0)

## 2024-06-21 LAB — ETHANOL: Alcohol, Ethyl (B): <15 mg/dL (ref <15)

## 2024-06-21 LAB — I-STAT CG4 LACTIC ACID, ED: Lactic Acid, Venous: 0.7 mmol/L (ref 0.5–1.9)

## 2024-06-21 MED ORDER — ONDANSETRON HCL 4 MG/2ML IJ SOLN
4.0000 mg | Freq: Once | INTRAMUSCULAR | Status: AC
  Administered 2024-06-21: 4 mg via INTRAVENOUS
  Filled 2024-06-21: qty 2

## 2024-06-21 MED ORDER — MORPHINE SULFATE (PF) 4 MG/ML IV SOLN
4.0000 mg | Freq: Once | INTRAVENOUS | Status: AC
  Administered 2024-06-21: 4 mg via INTRAVENOUS
  Filled 2024-06-21: qty 1

## 2024-06-21 MED ORDER — IOHEXOL 350 MG/ML SOLN
75.0000 mL | Freq: Once | INTRAVENOUS | Status: AC | PRN
  Administered 2024-06-21: 75 mL via INTRAVENOUS

## 2024-06-21 MED ORDER — GADOBUTROL 1 MMOL/ML IV SOLN
8.5000 mL | Freq: Once | INTRAVENOUS | Status: AC | PRN
  Administered 2024-06-21: 8.5 mL via INTRAVENOUS

## 2024-06-21 NOTE — ED Notes (Addendum)
 Patient transported to MRI

## 2024-06-21 NOTE — ED Triage Notes (Signed)
 Pt BIB [facility] from home where he tripped while walking to the bathroom & hit against the wall & landed on the ground. Denies hitting his head, did crawl & sit up to lean against the bed & was in that position when EMS arrived on scene. EMS reports no deformity/injury noted, just pt c/o pain in his back & Lt side rib cage area & Lt arm & neck. C-collar was applied by EMS & pt removed it upon arrival to ED. A/Ox4, 136/88, 88 bpm, 98% on RA, CBG 111. Hx of MS

## 2024-06-21 NOTE — Discharge Instructions (Addendum)
 Evaluate today was overall reassuring.  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  Please follow-up with your neurologist.  If you develop worsening weakness or numbness, facial droop slurred speech visual disturbance or any other concerning symptom please return to the ED for further evaluation.

## 2024-06-21 NOTE — ED Notes (Signed)
 X-ray at bedside

## 2024-06-21 NOTE — ED Provider Notes (Signed)
 [place] [facility]GENC[facility] DEPARTMENT AT [facility] Provider Note   CSN: [id number] Arrival date & time: 06/21/24  1026     Patient presents with: Fall  HPI Martin Barrett is a 43 y.o. male with PMH of multiple sclerosis, stroke presenting for fall.  Occurred around 5 AM this morning.  Patient states he woke up having the urge to urinate.  Noticed that he was feeling a little bit lightheaded and dizzy so much so that he felt like he could not make it to the toilet and voided in the trash can.  He turned around and then fell to the ground.  He now has right sided chest wall tenderness, the back of his neck hurts as well along with the lower back.  He denies chest pain shortness of breath or palpitations.  He states that he normally has left-sided weakness but he feels that his left leg is more weak than usual.  He states he did not have any of the symptoms prior to going to sleep around 9 PM last night.    Fall       Prior to Admission medications   Medication Sig Start Date End Date Taking? Authorizing Provider  amLODipine  (NORVASC ) 5 MG tablet Take 1 tablet (5 mg total) by mouth at bedtime. 06/15/24     bacitracin  500 UNIT/GM ointment Apply a small amount to affected area once daily for 7 days. 02/03/24     cariprazine  (VRAYLAR ) 3 MG capsule Take 1 capsule (3 mg total) by mouth daily. 12/23/23     cyclobenzaprine  (FLEXERIL ) 10 MG tablet Take 1 tablet (10 mg total) by mouth 2 (two) times daily as needed. Call [phone] to schedule follow up around 06/2024 for ongoing refills 04/18/24   Penumalli, Eduard SAUNDERS, MD  docusate sodium  (COLACE) 100 MG capsule Take 1 capsule (100 mg total) by mouth daily. 05/08/24     escitalopram  (LEXAPRO ) 10 MG tablet Take 1 tablet (10 mg total) by mouth daily. 03/19/24     gabapentin  (NEURONTIN ) 300 MG capsule Take 1-2 capsules (300-600 mg total) by mouth at bedtime. 07/04/23   Penumalli, Vikram R, MD  gabapentin  (NEURONTIN ) 300 MG capsule Take 1 capsule (300 mg  total) by mouth 3 (three) times daily. 05/24/24     hydrocortisone  ointment 0.5 % Apply topically 2 (two) times daily. 06/28/23     ibuprofen  (ADVIL ) 800 MG tablet Take 1 tablet every 8 hours as needed for pain 08/18/23     ocrelizumab  (OCREVUS ) 300 MG/10ML injection Inject 20 mLs (600 mg total) into the vein every 6 (six) months. 06/04/24   Penumalli, Eduard SAUNDERS, MD  ocrelizumab  in sodium chloride  0.9 % 500 mL Inject 300mg  into vein on day 1 & 15. Inject 600mg  into vein every 6 months thereafter. 04/19/23   Penumalli, Vikram R, MD  pantoprazole  (PROTONIX ) 20 MG tablet Take 1 tablet (20 mg total) by mouth daily. 06/15/24     sodium chloride  (DEEP SEA NASAL SPRAY) 0.65 % nasal spray Place 2 sprays into the nose daily. 02/03/24     traMADol  (ULTRAM ) 50 MG tablet Take 1 tablet (50 mg total) by mouth every 6 (six) to 8 (eight) hours as needed for pain. 04/18/24     DULoxetine  (CYMBALTA ) 30 MG capsule Take 1 capsule (30 mg total) by mouth daily. 07/04/23 04/27/24  Penumalli, Eduard SAUNDERS, MD    Allergies: Other and Strawberry (diagnostic)    Review of Systems See HPI   Physical Exam   Vitals:  06/21/24 1400 06/21/24 1501  BP: (!) 129/91   Pulse: 88   Resp: 13   Temp:  97.7 F (36.5 C)  SpO2: 100%     CONSTITUTIONAL:  well-appearing, NAD NEURO:  Alert and oriented x 3, CN 3-12 grossly intact, equal strength in the upper extremities, left leg notably weak comparison to the right.  Sensation is intact to light touch distally in all extremities, normal finger-to-nose EYES:  eyes equal and reactive, no nystagmus ENT/NECK:  Supple, no stridor  Chest: Tenderness with palpation to the right chest wall, otherwise atraumatic CARDIO: regular rate and rhythm, appears well-perfused  PULM:  No respiratory distress, CTAB GI/GU:  non-distended, soft, non tended MSK/SPINE:  No gross deformities, no edema, moves all extremities, tenderness of the midline of the lower back SKIN:  no rash, atraumatic  *Additional and/or  pertinent findings included in MDM below  (all labs ordered are listed, but only abnormal results are displayed) Labs Reviewed  COMPREHENSIVE METABOLIC PANEL WITH GFR - Abnormal; Notable for the following components:      Result Value   Creatinine, Ser 1.32 (*)    All other components within normal limits  CBC - Abnormal; Notable for the following components:   MCV 102.5 (*)    MCH 34.2 (*)    All other components within normal limits  I-STAT CHEM 8, ED - Abnormal; Notable for the following components:   Creatinine, Ser 1.40 (*)    Calcium, Ion 1.00 (*)    All other components within normal limits  ETHANOL  PROTIME-INR  URINALYSIS, ROUTINE W REFLEX MICROSCOPIC  I-STAT CG4 LACTIC ACID, ED  SAMPLE TO BLOOD BANK    EKG: None  Radiology: CT CERVICAL SPINE WO CONTRAST Result Date: 06/21/2024 CLINICAL DATA:  Provided history: Polytrauma, blunt. EXAM: CT CERVICAL, THORACIC, AND LUMBAR SPINE WITHOUT CONTRAST TECHNIQUE: Multidetector CT imaging of the cervical, thoracic and lumbar spine was performed without intravenous contrast. Multiplanar CT image reconstructions were also generated. RADIATION DOSE REDUCTION: This exam was performed according to the departmental dose-optimization program which includes automated exposure control, adjustment of the mA and/or kV according to patient size and/or use of iterative reconstruction technique. COMPARISON:  Same day cervical spine MRI 06/21/2024. FINDINGS: CT CERVICAL SPINE FINDINGS Alignment: Nonspecific straightening of the expected cervical lordosis. No significant spondylolisthesis. Skull base and vertebrae: The basion-dental and atlanto-dental intervals are maintained.No evidence of acute fracture to the cervical spine. Soft tissues and spinal canal: No prevertebral fluid or swelling. No visible canal hematoma. Disc levels: Cervical spondylosis as described in detail on the same day cervical spine MRI. Upper chest: Intrathoracic findings separately  reported on same day CT chest/abdomen/pelvis. CT THORACIC SPINE FINDINGS Alignment: No significant spondylolisthesis. Vertebrae: Vertebral body height is maintained. No evidence of an acute thoracic spine fracture. Paraspinal and other soft tissues: No paraspinal mass or collection/hematoma. Intrathoracic and abdominopelvic findings separately reported on same day CT chest/abdomen/pelvis. Disc levels: Moderate disc space narrowing at T4-T5. No more than mild disc space narrowing at the remaining thoracic levels. Mild facet arthropathy and spurring bilaterally at T10-T11. No significant spinal canal stenosis is appreciated. No significant bony neural foraminal narrowing. T11-T12 ventrolateral osteophyte. CT LUMBAR SPINE FINDINGS Segmentation: 5 lumbar vertebrae. The caudal most well-formed intervertebral disc space is designated L5-S1. Alignment: Slight grade 1 retrolisthesis at L1-L2, L2-L3, L3-L4 and L4-L5. Vertebrae: Vertebral body height is maintained. No evidence of an acute fracture to the lumbar spine. Paraspinal and other soft tissues: No paraspinal mass or collection/hematoma. Abdominopelvic findings  separately reported on same day CT chest/abdomen/pelvis. Disc levels: Multilevel disc bulges. Mild-to-moderate bilateral facet arthropathy at L2-L3. No significant spinal canal stenosis is appreciated. No more than mild neural foraminal narrowing. Other: Bilateral sacroiliac joint fusion anteriorly. IMPRESSION: Cervical spine: 1. No evidence of acute cervical spine fracture. 2. Nonspecific straightening of the expected cervical lordosis. 3. Cervical spondylosis as described in detail on the same day cervical spine MRI. Thoracic spine: 1. No evidence of an acute thoracic spine fracture. 2. Thoracic spondylosis as described. Lumbar spine: 1. No evidence of an acute lumbar spine fracture. 2. Mild grade 1 retrolisthesis at L1-L2, L2-L3, L3-L4 and L4-L5. 3. Lumbar spondylosis as described. 4. Bilateral sacroiliac  joint fusion anteriorly. Electronically Signed   By: Rockey Childs D.O.   On: 06/21/2024 14:18   CT L-SPINE NO CHARGE Result Date: 06/21/2024 CLINICAL DATA:  Provided history: Polytrauma, blunt. EXAM: CT CERVICAL, THORACIC, AND LUMBAR SPINE WITHOUT CONTRAST TECHNIQUE: Multidetector CT imaging of the cervical, thoracic and lumbar spine was performed without intravenous contrast. Multiplanar CT image reconstructions were also generated. RADIATION DOSE REDUCTION: This exam was performed according to the departmental dose-optimization program which includes automated exposure control, adjustment of the mA and/or kV according to patient size and/or use of iterative reconstruction technique. COMPARISON:  Same day cervical spine MRI 06/21/2024. FINDINGS: CT CERVICAL SPINE FINDINGS Alignment: Nonspecific straightening of the expected cervical lordosis. No significant spondylolisthesis. Skull base and vertebrae: The basion-dental and atlanto-dental intervals are maintained.No evidence of acute fracture to the cervical spine. Soft tissues and spinal canal: No prevertebral fluid or swelling. No visible canal hematoma. Disc levels: Cervical spondylosis as described in detail on the same day cervical spine MRI. Upper chest: Intrathoracic findings separately reported on same day CT chest/abdomen/pelvis. CT THORACIC SPINE FINDINGS Alignment: No significant spondylolisthesis. Vertebrae: Vertebral body height is maintained. No evidence of an acute thoracic spine fracture. Paraspinal and other soft tissues: No paraspinal mass or collection/hematoma. Intrathoracic and abdominopelvic findings separately reported on same day CT chest/abdomen/pelvis. Disc levels: Moderate disc space narrowing at T4-T5. No more than mild disc space narrowing at the remaining thoracic levels. Mild facet arthropathy and spurring bilaterally at T10-T11. No significant spinal canal stenosis is appreciated. No significant bony neural foraminal narrowing.  T11-T12 ventrolateral osteophyte. CT LUMBAR SPINE FINDINGS Segmentation: 5 lumbar vertebrae. The caudal most well-formed intervertebral disc space is designated L5-S1. Alignment: Slight grade 1 retrolisthesis at L1-L2, L2-L3, L3-L4 and L4-L5. Vertebrae: Vertebral body height is maintained. No evidence of an acute fracture to the lumbar spine. Paraspinal and other soft tissues: No paraspinal mass or collection/hematoma. Abdominopelvic findings separately reported on same day CT chest/abdomen/pelvis. Disc levels: Multilevel disc bulges. Mild-to-moderate bilateral facet arthropathy at L2-L3. No significant spinal canal stenosis is appreciated. No more than mild neural foraminal narrowing. Other: Bilateral sacroiliac joint fusion anteriorly. IMPRESSION: Cervical spine: 1. No evidence of acute cervical spine fracture. 2. Nonspecific straightening of the expected cervical lordosis. 3. Cervical spondylosis as described in detail on the same day cervical spine MRI. Thoracic spine: 1. No evidence of an acute thoracic spine fracture. 2. Thoracic spondylosis as described. Lumbar spine: 1. No evidence of an acute lumbar spine fracture. 2. Mild grade 1 retrolisthesis at L1-L2, L2-L3, L3-L4 and L4-L5. 3. Lumbar spondylosis as described. 4. Bilateral sacroiliac joint fusion anteriorly. Electronically Signed   By: Rockey Childs D.O.   On: 06/21/2024 14:18   CT T-SPINE NO CHARGE Result Date: 06/21/2024 CLINICAL DATA:  Provided history: Polytrauma, blunt. EXAM: CT CERVICAL, THORACIC, AND  LUMBAR SPINE WITHOUT CONTRAST TECHNIQUE: Multidetector CT imaging of the cervical, thoracic and lumbar spine was performed without intravenous contrast. Multiplanar CT image reconstructions were also generated. RADIATION DOSE REDUCTION: This exam was performed according to the departmental dose-optimization program which includes automated exposure control, adjustment of the mA and/or kV according to patient size and/or use of iterative  reconstruction technique. COMPARISON:  Same day cervical spine MRI 06/21/2024. FINDINGS: CT CERVICAL SPINE FINDINGS Alignment: Nonspecific straightening of the expected cervical lordosis. No significant spondylolisthesis. Skull base and vertebrae: The basion-dental and atlanto-dental intervals are maintained.No evidence of acute fracture to the cervical spine. Soft tissues and spinal canal: No prevertebral fluid or swelling. No visible canal hematoma. Disc levels: Cervical spondylosis as described in detail on the same day cervical spine MRI. Upper chest: Intrathoracic findings separately reported on same day CT chest/abdomen/pelvis. CT THORACIC SPINE FINDINGS Alignment: No significant spondylolisthesis. Vertebrae: Vertebral body height is maintained. No evidence of an acute thoracic spine fracture. Paraspinal and other soft tissues: No paraspinal mass or collection/hematoma. Intrathoracic and abdominopelvic findings separately reported on same day CT chest/abdomen/pelvis. Disc levels: Moderate disc space narrowing at T4-T5. No more than mild disc space narrowing at the remaining thoracic levels. Mild facet arthropathy and spurring bilaterally at T10-T11. No significant spinal canal stenosis is appreciated. No significant bony neural foraminal narrowing. T11-T12 ventrolateral osteophyte. CT LUMBAR SPINE FINDINGS Segmentation: 5 lumbar vertebrae. The caudal most well-formed intervertebral disc space is designated L5-S1. Alignment: Slight grade 1 retrolisthesis at L1-L2, L2-L3, L3-L4 and L4-L5. Vertebrae: Vertebral body height is maintained. No evidence of an acute fracture to the lumbar spine. Paraspinal and other soft tissues: No paraspinal mass or collection/hematoma. Abdominopelvic findings separately reported on same day CT chest/abdomen/pelvis. Disc levels: Multilevel disc bulges. Mild-to-moderate bilateral facet arthropathy at L2-L3. No significant spinal canal stenosis is appreciated. No more than mild neural  foraminal narrowing. Other: Bilateral sacroiliac joint fusion anteriorly. IMPRESSION: Cervical spine: 1. No evidence of acute cervical spine fracture. 2. Nonspecific straightening of the expected cervical lordosis. 3. Cervical spondylosis as described in detail on the same day cervical spine MRI. Thoracic spine: 1. No evidence of an acute thoracic spine fracture. 2. Thoracic spondylosis as described. Lumbar spine: 1. No evidence of an acute lumbar spine fracture. 2. Mild grade 1 retrolisthesis at L1-L2, L2-L3, L3-L4 and L4-L5. 3. Lumbar spondylosis as described. 4. Bilateral sacroiliac joint fusion anteriorly. Electronically Signed   By: Rockey Childs D.O.   On: 06/21/2024 14:18   CT CHEST ABDOMEN PELVIS W CONTRAST Result Date: 06/21/2024 CLINICAL DATA:  Polytrauma, blunt.  Fall. EXAM: CT CHEST, ABDOMEN, AND PELVIS WITH CONTRAST TECHNIQUE: Multidetector CT imaging of the chest, abdomen and pelvis was performed following the standard protocol during bolus administration of intravenous contrast. RADIATION DOSE REDUCTION: This exam was performed according to the departmental dose-optimization program which includes automated exposure control, adjustment of the mA and/or kV according to patient size and/or use of iterative reconstruction technique. CONTRAST:  75mL OMNIPAQUE  IOHEXOL  350 MG/ML SOLN COMPARISON:  None Available. FINDINGS: CT CHEST FINDINGS Cardiovascular: Heart is normal size. Aorta is normal caliber. Mediastinum/Nodes: No mediastinal, hilar, or axillary adenopathy. Trachea and esophagus are unremarkable. Thyroid unremarkable. Soft tissue in the anterior mediastinum felt to reflect residual thymus. Lungs/Pleura: 6 mm ground-glass nodular density in the left upper lobe on image 78. No additional confluent opacities or effusions. No pneumothorax. Musculoskeletal: Chest wall soft tissues are unremarkable. No acute bony abnormality. CT ABDOMEN PELVIS FINDINGS Hepatobiliary: No hepatic injury or perihepatic  hematoma. Gallbladder is unremarkable. Pancreas: No focal abnormality or ductal dilatation. Spleen: No splenic injury or perisplenic hematoma. Adrenals/Urinary Tract: No adrenal hemorrhage or renal injury identified. Bladder is unremarkable. Stomach/Bowel: Normal appendix. Stomach, large and small bowel grossly unremarkable. Vascular/Lymphatic: No evidence of aneurysm or adenopathy. Reproductive: No visible focal abnormality. Other: No free fluid or free air. Musculoskeletal: No acute bony abnormality IMPRESSION: No acute findings or significant traumatic injury in the chest, abdomen or pelvis. 6 mm ground-glass nodule in the left lower lobe. Initial follow-up with CT at 6 months is recommended to confirm persistence. If persistent, repeat CT is recommended every 2 years until 5 years of stability has been established. This recommendation follows the consensus statement: Guidelines for Management of Incidental Pulmonary Nodules Detected on CT Images: From the Fleischner Society 2017; Radiology 2017; 284:228-243. Electronically Signed   By: Franky Crease M.D.   On: 06/21/2024 14:12   CT HEAD WO CONTRAST Result Date: 06/21/2024 CLINICAL DATA:  Head trauma, moderate-severe, history of multiple sclerosis. EXAM: CT HEAD WITHOUT CONTRAST TECHNIQUE: Contiguous axial images were obtained from the base of the skull through the vertex without intravenous contrast. RADIATION DOSE REDUCTION: This exam was performed according to the departmental dose-optimization program which includes automated exposure control, adjustment of the mA and/or kV according to patient size and/or use of iterative reconstruction technique. COMPARISON:  MRI of the head dated June 21, 2024 and February 12, 2023. FINDINGS: Brain: There is no evidence of acute intracranial injury. There is diminished attenuation present within the periventricular white matter along the posterior horns of the lateral ventricles bilaterally. There is no evidence of  hemorrhage, mass or hydrocephalus. Vascular: Negative. Skull: Intact and unremarkable. Sinuses/Orbits: Clear paranasal sinuses.  Normal orbits. Other: None. IMPRESSION: 1. No evidence acute intracranial injury. 2. Periventricular white matter disease along the posterior horns of the lateral ventricles, compatible with underlying demyelinating disease. Electronically Signed   By: Evalene Coho M.D.   On: 06/21/2024 13:53   MR Brain W and Wo Contrast Result Date: 06/21/2024 CLINICAL DATA:  Provided history: Neuro deficit, acute, stroke suspected. Left-sided weakness. Additional history provided: fall (with head trauma). Additional history obtained from prior radiology records: History of multiple sclerosis. EXAM: MRI HEAD WITHOUT AND WITH CONTRAST MRI CERVICAL SPINE WITHOUT AND WITH CONTRAST TECHNIQUE: Multiplanar, multiecho pulse sequences of the brain and surrounding structures, and cervical spine, to include the craniocervical junction and cervicothoracic junction, were obtained without and with intravenous contrast. CONTRAST:  8.5mL GADAVIST  GADOBUTROL  1 MMOL/ML IV SOLN COMPARISON:  Brain MRI 02/22/2024. Cervical spine MRI 01/26/2023. FINDINGS: MRI HEAD FINDINGS Brain: No age-advanced or lobar predominant cerebral atrophy. Multifocal T2 FLAIR hyperintense signal abnormality within the deep periventricular and juxtacortical cerebral white matter, corpus callosum and pons. Findings have not appreciably changed from the prior MRI of 02/22/2024 and are compatible with the history of multiple sclerosis. Foci of chronic Wallerian degeneration along the bilateral corticospinal tracts also again noted. No pathologic intracranial enhancement to suggest active demyelination. There is no acute infarct. No evidence of an intracranial mass. No chronic intracranial blood products. No extra-axial fluid collection. No midline shift. Vascular: Maintained flow voids within the proximal large arterial vessels. Skull and upper  cervical spine: No focal worrisome marrow lesion. Sinuses/Orbits: No mass or acute finding within the imaged orbits. No significant paranasal sinus disease. MRI CERVICAL SPINE FINDINGS Alignment: No significant spondylolisthesis. Vertebrae: Cervical vertebral body height is maintained. No significant marrow edema or focal worrisome marrow lesion. Cord: Short-segment T2 hyperintense lesion  within the dorsal aspect of the spinal cord at the C2 level, new from the prior MRI of 01/26/2023 (series 15, image 3). Short-segment T2 hyperintense lesion within the left aspect of the spinal cord at the C6 level, unchanged. The subtle lesions described at the craniocervical junction and C3 level on the prior MRI are not appreciated on today's exam. No pathologic spinal cord enhancement to suggest active demyelination. Posterior Fossa, vertebral arteries, paraspinal tissues: Posterior fossa assessed on same-day brain MRI. Flow voids preserved within visible portions of the cervical vertebral arteries. No paraspinal mass or collection. Disc levels: Unless otherwise stated, the level by level findings below have not significantly changed from the prior MRI of 01/26/2023. No more than mild disc degeneration within the cervical spine. C2-C3: Mild facet arthropathy. No significant disc herniation or stenosis. C3-C4: No significant disc herniation or spinal canal stenosis. Bilateral uncovertebral hypertrophy. Minimal facet arthropathy on the right. Mild bilateral neural foramen narrowing (greater on the left). C4-C5: No significant disc herniation or spinal canal stenosis. Bilateral uncovertebral hypertrophy. Mild facet arthropathy. Bilateral neural foraminal narrowing (moderate right, severe left), progressed on the left. C5-C6: No significant disc herniation or spinal canal stenosis. Bilateral uncovertebral hypertrophy. Mild facet arthropathy. Mild right neural foraminal narrowing. C6-C7: Facet arthropathy (greater on the left and  moderate on the left). No significant disc herniation or stenosis. C7-T1: Moderate facet arthropathy. No significant disc herniation or stenosis. IMPRESSION: MRI brain: 1.  No evidence of an acute intracranial abnormality. 2. Multifocal T2 FLAIR hyperintense signal abnormality within the deep periventricular and juxtacortical cerebral white matter, corpus callosum and pons. Findings are unchanged from the prior brain MRI of 02/22/2024 and compatible with the history of multiple sclerosis. No pathologic intracranial enhancement to suggest active demyelination. MRI cervical spine: 1. Chronic lesion within the dorsal spinal cord at the C2 level, new from the prior MRI of 01/26/2023. Chronic lesion within the left aspect of the spinal cord at the C6 level, unchanged. Findings are compatible with sequela of demyelinating disease. No pathologic cervical spinal cord enhancement to suggest active demyelination. 2. Cervical spondylosis as outlined within the body of the report. No significant spinal canal stenosis. Multilevel foraminal stenosis, greatest bilaterally at C4-C5 (moderate right, severe left). Electronically Signed   By: Rockey Childs D.O.   On: 06/21/2024 13:47   MR Cervical Spine W and Wo Contrast Result Date: 06/21/2024 CLINICAL DATA:  Provided history: Neuro deficit, acute, stroke suspected. Left-sided weakness. Additional history provided: fall (with head trauma). Additional history obtained from prior radiology records: History of multiple sclerosis. EXAM: MRI HEAD WITHOUT AND WITH CONTRAST MRI CERVICAL SPINE WITHOUT AND WITH CONTRAST TECHNIQUE: Multiplanar, multiecho pulse sequences of the brain and surrounding structures, and cervical spine, to include the craniocervical junction and cervicothoracic junction, were obtained without and with intravenous contrast. CONTRAST:  8.5mL GADAVIST  GADOBUTROL  1 MMOL/ML IV SOLN COMPARISON:  Brain MRI 02/22/2024. Cervical spine MRI 01/26/2023. FINDINGS: MRI HEAD  FINDINGS Brain: No age-advanced or lobar predominant cerebral atrophy. Multifocal T2 FLAIR hyperintense signal abnormality within the deep periventricular and juxtacortical cerebral white matter, corpus callosum and pons. Findings have not appreciably changed from the prior MRI of 02/22/2024 and are compatible with the history of multiple sclerosis. Foci of chronic Wallerian degeneration along the bilateral corticospinal tracts also again noted. No pathologic intracranial enhancement to suggest active demyelination. There is no acute infarct. No evidence of an intracranial mass. No chronic intracranial blood products. No extra-axial fluid collection. No midline shift. Vascular: Maintained flow voids  within the proximal large arterial vessels. Skull and upper cervical spine: No focal worrisome marrow lesion. Sinuses/Orbits: No mass or acute finding within the imaged orbits. No significant paranasal sinus disease. MRI CERVICAL SPINE FINDINGS Alignment: No significant spondylolisthesis. Vertebrae: Cervical vertebral body height is maintained. No significant marrow edema or focal worrisome marrow lesion. Cord: Short-segment T2 hyperintense lesion within the dorsal aspect of the spinal cord at the C2 level, new from the prior MRI of 01/26/2023 (series 15, image 3). Short-segment T2 hyperintense lesion within the left aspect of the spinal cord at the C6 level, unchanged. The subtle lesions described at the craniocervical junction and C3 level on the prior MRI are not appreciated on today's exam. No pathologic spinal cord enhancement to suggest active demyelination. Posterior Fossa, vertebral arteries, paraspinal tissues: Posterior fossa assessed on same-day brain MRI. Flow voids preserved within visible portions of the cervical vertebral arteries. No paraspinal mass or collection. Disc levels: Unless otherwise stated, the level by level findings below have not significantly changed from the prior MRI of 01/26/2023. No  more than mild disc degeneration within the cervical spine. C2-C3: Mild facet arthropathy. No significant disc herniation or stenosis. C3-C4: No significant disc herniation or spinal canal stenosis. Bilateral uncovertebral hypertrophy. Minimal facet arthropathy on the right. Mild bilateral neural foramen narrowing (greater on the left). C4-C5: No significant disc herniation or spinal canal stenosis. Bilateral uncovertebral hypertrophy. Mild facet arthropathy. Bilateral neural foraminal narrowing (moderate right, severe left), progressed on the left. C5-C6: No significant disc herniation or spinal canal stenosis. Bilateral uncovertebral hypertrophy. Mild facet arthropathy. Mild right neural foraminal narrowing. C6-C7: Facet arthropathy (greater on the left and moderate on the left). No significant disc herniation or stenosis. C7-T1: Moderate facet arthropathy. No significant disc herniation or stenosis. IMPRESSION: MRI brain: 1.  No evidence of an acute intracranial abnormality. 2. Multifocal T2 FLAIR hyperintense signal abnormality within the deep periventricular and juxtacortical cerebral white matter, corpus callosum and pons. Findings are unchanged from the prior brain MRI of 02/22/2024 and compatible with the history of multiple sclerosis. No pathologic intracranial enhancement to suggest active demyelination. MRI cervical spine: 1. Chronic lesion within the dorsal spinal cord at the C2 level, new from the prior MRI of 01/26/2023. Chronic lesion within the left aspect of the spinal cord at the C6 level, unchanged. Findings are compatible with sequela of demyelinating disease. No pathologic cervical spinal cord enhancement to suggest active demyelination. 2. Cervical spondylosis as outlined within the body of the report. No significant spinal canal stenosis. Multilevel foraminal stenosis, greatest bilaterally at C4-C5 (moderate right, severe left). Electronically Signed   By: Rockey Childs D.O.   On: 06/21/2024  13:47   DG Pelvis Portable Result Date: 06/21/2024 CLINICAL DATA:  Tripped and fell. EXAM: PORTABLE PELVIS 1-2 VIEWS COMPARISON:  None Available. FINDINGS: There is no evidence of pelvic fracture or diastasis. No pelvic bone lesions are seen. IMPRESSION: Negative. Electronically Signed   By: Camellia Candle M.D.   On: 06/21/2024 12:10   DG Chest Port 1 View Result Date: 06/21/2024 CLINICAL DATA:  Patient tripped and fell. EXAM: PORTABLE CHEST 1 VIEW COMPARISON:  12/01/2023 FINDINGS: The heart size and mediastinal contours are within normal limits. Both lungs are clear. The visualized skeletal structures are unremarkable. IMPRESSION: No active disease. Electronically Signed   By: Camellia Candle M.D.   On: 06/21/2024 12:09     Procedures   Medications Ordered in the ED  ondansetron  (ZOFRAN ) injection 4 mg (has no administration in time  range)  morphine  (PF) 4 MG/ML injection 4 mg (4 mg Intravenous Given 06/21/24 1156)  ondansetron  (ZOFRAN ) injection 4 mg (4 mg Intravenous Given 06/21/24 1156)  gadobutrol  (GADAVIST ) 1 MMOL/ML injection 8.5 mL (8.5 mLs Intravenous Contrast Given 06/21/24 1252)  iohexol  (OMNIPAQUE ) 350 MG/ML injection 75 mL (75 mLs Intravenous Contrast Given 06/21/24 1336)                                    Medical Decision Making Amount and/or Complexity of Data Reviewed Labs: ordered. Radiology: ordered.  Risk Prescription drug management.   Initial Impression and Ddx 43 year old well-appearing male presenting for fall and weakness.  Exam notable for left-sided weakness.  DDx includes acute stroke, MS flare, traumatic chest intra-abdominal or pelvic injury, electrolyte derangement, other. Patient PMH that increases complexity of ED encounter:  PMH of multiple sclerosis, stroke   Interpretation of Diagnostics - I independent reviewed and interpreted the labs as followed: No acute derangement   - I independently visualized the following imaging with scope of interpretation  limited to determining acute life threatening conditions related to emergency care: MRI cervical spine showed chronic lesion about the dorsal spine at the level C2 but no active demyelination, other scans were without acute findings.  Shared details with patient.  Details are above.  Patient Reassessment and Ultimate Disposition/Management Overall workup was reassuring.  On reassessment was still somewhat nauseous but symptomatically improved.  Shared findings from the MRI cervical spine.  Patient does have close follow-up with [facility] neurology.  Given there is no active demyelination and no suggestions of acute stroke during this workup feel he is appropriate for discharge and follow-up with his neurologist.  We discussed return precautions as well.  Discharged in good condition.  Patient management required discussion with the following services or consulting groups:  None  Complexity of Problems Addressed Acute complicated illness or Injury  Additional Data Reviewed and Analyzed Further history obtained from: Past medical history and medications listed in the EMR and Prior ED visit notes  Patient Encounter Risk Assessment Consideration of hospitalization      Final diagnoses:  Fall, initial encounter    ED Discharge Orders     None          Lang Norleen POUR, PA-C 06/21/24 1511    Tegeler, Lonni PARAS, MD 06/21/24 1557

## 2024-06-21 NOTE — ED Notes (Signed)
 Patient transported to MRI

## 2024-06-22 ENCOUNTER — Other Ambulatory Visit: Payer: Self-pay

## 2024-06-24 ENCOUNTER — Emergency Department (HOSPITAL_COMMUNITY)

## 2024-06-24 ENCOUNTER — Other Ambulatory Visit: Payer: Self-pay

## 2024-06-24 ENCOUNTER — Encounter (HOSPITAL_COMMUNITY): Payer: Self-pay

## 2024-06-24 ENCOUNTER — Emergency Department (HOSPITAL_COMMUNITY)
Admission: EM | Admit: 2024-06-24 | Discharge: 2024-06-25 | Disposition: A | Discharge status: Home / Self Care | Attending: Emergency Medicine | Admitting: Emergency Medicine

## 2024-06-24 DIAGNOSIS — R29898 Other symptoms and signs involving the musculoskeletal system: Secondary | ICD-10-CM

## 2024-06-24 DIAGNOSIS — R200 Anesthesia of skin: Secondary | ICD-10-CM | POA: Insufficient documentation

## 2024-06-24 DIAGNOSIS — N189 Chronic kidney disease, unspecified: Secondary | ICD-10-CM | POA: Insufficient documentation

## 2024-06-24 DIAGNOSIS — R531 Weakness: Secondary | ICD-10-CM | POA: Insufficient documentation

## 2024-06-24 DIAGNOSIS — G35 Multiple sclerosis: Secondary | ICD-10-CM

## 2024-06-24 HISTORY — DX: Multiple sclerosis: G35

## 2024-06-24 LAB — CBC WITH DIFFERENTIAL/PLATELET
Abs Immature Granulocytes: 0.03 K/uL (ref 0.00–0.07)
Basophils Absolute: 0 K/uL (ref 0.0–0.1)
Basophils Relative: 0 %
Eosinophils Absolute: 0.1 K/uL (ref 0.0–0.5)
Eosinophils Relative: 1 %
HCT: 44 % (ref 39.0–52.0)
Hemoglobin: 15.1 g/dL (ref 13.0–17.0)
Immature Granulocytes: 0 %
Lymphocytes Relative: 18 %
Lymphs Abs: 1.7 K/uL (ref 0.7–4.0)
MCH: 34.6 pg — ABNORMAL HIGH (ref 26.0–34.0)
MCHC: 34.3 g/dL (ref 30.0–36.0)
MCV: 100.9 fL — ABNORMAL HIGH (ref 80.0–100.0)
Monocytes Absolute: 0.9 K/uL (ref 0.1–1.0)
Monocytes Relative: 9 %
Neutro Abs: 6.9 K/uL (ref 1.7–7.7)
Neutrophils Relative %: 72 %
Platelets: 234 K/uL (ref 150–400)
RBC: 4.36 MIL/uL (ref 4.22–5.81)
RDW: 13.7 % (ref 11.5–15.5)
WBC: 9.6 K/uL (ref 4.0–10.5)
nRBC: 0 % (ref 0.0–0.2)

## 2024-06-24 LAB — COMPREHENSIVE METABOLIC PANEL WITH GFR
ALT: 14 U/L (ref 0–44)
AST: 15 U/L (ref 15–41)
Albumin: 3.9 g/dL (ref 3.5–5.0)
Alkaline Phosphatase: 68 U/L (ref 38–126)
Anion gap: 12 (ref 5–15)
BUN: 7 mg/dL (ref 6–20)
CO2: 25 mmol/L (ref 22–32)
Calcium: 9.1 mg/dL (ref 8.9–10.3)
Chloride: 102 mmol/L (ref 98–111)
Creatinine, Ser: 1.37 mg/dL — ABNORMAL HIGH (ref 0.61–1.24)
GFR, Estimated: >60 mL/min (ref >60)
Glucose, Bld: 96 mg/dL (ref 70–99)
Potassium: 3.6 mmol/L (ref 3.5–5.1)
Sodium: 139 mmol/L (ref 135–145)
Total Bilirubin: 1 mg/dL (ref 0.0–1.2)
Total Protein: 7 g/dL (ref 6.5–8.1)

## 2024-06-24 LAB — URINALYSIS, ROUTINE W REFLEX MICROSCOPIC
Bilirubin Urine: NEGATIVE
Glucose, UA: NEGATIVE mg/dL
Ketones, ur: 5 mg/dL — AB
Leukocytes,Ua: NEGATIVE
Nitrite: NEGATIVE
Protein, ur: NEGATIVE mg/dL
Specific Gravity, Urine: >1.046 — ABNORMAL HIGH (ref 1.005–1.030)
pH: 5 (ref 5.0–8.0)

## 2024-06-24 MED ORDER — IOHEXOL 350 MG/ML SOLN
75.0000 mL | Freq: Once | INTRAVENOUS | Status: AC | PRN
  Administered 2024-06-24: 75 mL via INTRAVENOUS

## 2024-06-24 MED ORDER — GADOBUTROL 1 MMOL/ML IV SOLN
8.0000 mL | Freq: Once | INTRAVENOUS | Status: AC | PRN
  Administered 2024-06-24: 8 mL via INTRAVENOUS

## 2024-06-24 MED ORDER — SODIUM CHLORIDE 0.9 % IV BOLUS
500.0000 mL | Freq: Once | INTRAVENOUS | Status: AC
  Administered 2024-06-24: 500 mL via INTRAVENOUS

## 2024-06-24 NOTE — ED Provider Notes (Signed)
 Martin Barrett Provider Note   CSN: [id number] Arrival date & time: 06/24/24  1734     Patient presents with: No chief complaint on file.   Martin Barrett is a 43 y.o. male.   43 year old male with a history of relapsing remitting MS and possible stroke who presents emergency department for left-sided weakness.  Says has been getting worse over the past 2 to 3 weeks.  Says that he noticed some weakness in his left leg.  Started having numbness on the left side of his face and arm.  EMS reports that his symptoms worsened today at noon but patient says that they have been gradually worsening to me.  No recent illnesses.  No headache.  Thinks that maybe is MS worsening.  Lives at home with a home health aide.  Having difficulty walking because of the symptoms       Prior to Admission medications   Medication Sig Start Date End Date Taking? Authorizing Provider  amLODipine  (NORVASC ) 5 MG tablet Take 1 tablet (5 mg total) by mouth at bedtime. 06/15/24     bacitracin  500 UNIT/GM ointment Apply a small amount to affected area once daily for 7 days. 02/03/24     cariprazine  (VRAYLAR ) 3 MG capsule Take 1 capsule (3 mg total) by mouth daily. 12/23/23     cyclobenzaprine  (FLEXERIL ) 10 MG tablet Take 1 tablet (10 mg total) by mouth 2 (two) times daily as needed. Call [phone] to schedule follow up around 06/2024 for ongoing refills 04/18/24   Penumalli, Eduard SAUNDERS, MD  docusate sodium  (COLACE) 100 MG capsule Take 1 capsule (100 mg total) by mouth daily. 05/08/24     escitalopram  (LEXAPRO ) 10 MG tablet Take 1 tablet (10 mg total) by mouth daily. 03/19/24     gabapentin  (NEURONTIN ) 300 MG capsule Take 1-2 capsules (300-600 mg total) by mouth at bedtime. 07/04/23   Penumalli, Eduard SAUNDERS, MD  gabapentin  (NEURONTIN ) 300 MG capsule Take 1 capsule (300 mg total) by mouth 3 (three) times daily. 05/24/24     hydrocortisone  ointment 0.5 % Apply topically 2 (two) times daily.  06/28/23     ibuprofen  (ADVIL ) 800 MG tablet Take 1 tablet every 8 hours as needed for pain 08/18/23     ocrelizumab  (OCREVUS ) 300 MG/10ML injection Inject 20 mLs (600 mg total) into the vein every 6 (six) months. 06/04/24   Penumalli, Eduard SAUNDERS, MD  ocrelizumab  in sodium chloride  0.9 % 500 mL Inject 300mg  into vein on day 1 & 15. Inject 600mg  into vein every 6 months thereafter. 04/19/23   Penumalli, Eduard SAUNDERS, MD  pantoprazole  (PROTONIX ) 20 MG tablet Take 1 tablet (20 mg total) by mouth daily. 06/15/24     sodium chloride  (DEEP SEA NASAL SPRAY) 0.65 % nasal spray Place 2 sprays into the nose daily. 02/03/24     traMADol  (ULTRAM ) 50 MG tablet Take 1 tablet (50 mg total) by mouth every 6 (six) to 8 (eight) hours as needed for pain. 04/18/24     DULoxetine  (CYMBALTA ) 30 MG capsule Take 1 capsule (30 mg total) by mouth daily. 07/04/23 04/27/24  Penumalli, Eduard SAUNDERS, MD    Allergies: Other and Strawberry (diagnostic)    Review of Systems  Updated Vital Signs BP (!) 110/90   Temp 98.5 F (36.9 C) (Oral)   Resp 16   Physical Exam Vitals and nursing note reviewed.  Constitutional:      General: He is not in acute distress.  Appearance: He is well-developed.  HENT:     Head: Normocephalic and atraumatic.     Right Ear: External ear normal.     Left Ear: External ear normal.     Nose: Nose normal.  Eyes:     Extraocular Movements: Extraocular movements intact.     Conjunctiva/sclera: Conjunctivae normal.     Pupils: Pupils are equal, round, and reactive to light.  Musculoskeletal:     Cervical back: Normal range of motion and neck supple.     Right lower leg: No edema.     Left lower leg: No edema.  Skin:    General: Skin is warm and dry.  Neurological:     Mental Status: He is alert. Mental status is at baseline.     Comments: NIHSS Exam  Level of Consciousness: Alert  LOC Questions: Answers Month and Age Correctly  LOC Commands: Opens and Closes Eyes and Hands on command  Best Gaze:  Disconjugate gaze.  Fast beating nystagmus with right gaze. Visual Fields: No visual field loss  Facial Palsy: None  L Upper Extremity Motor: No drift after 10 seconds  R Upper Extremity Motor: No drift after 10 seconds  L Lower extremity Motor: Drift and hits stretcher R Lower extremity Motor: No drift after 5 seconds  Ataxia: Absent  Sensory: Diminished sensation to light touch on left side of face, left upper extremity, and left lower extremity Best Language: No aphasia  Dysarthria: Mild dysarthria  Neglect: No visual or sensory neglect    Psychiatric:        Mood and Affect: Mood normal.        Behavior: Behavior normal.     (all labs ordered are listed, but only abnormal results are displayed) Labs Reviewed  CBC WITH DIFFERENTIAL/PLATELET - Abnormal; Notable for the following components:      Result Value   MCV 100.9 (*)    MCH 34.6 (*)    All other components within normal limits  COMPREHENSIVE METABOLIC PANEL WITH GFR - Abnormal; Notable for the following components:   Creatinine, Ser 1.37 (*)    All other components within normal limits  URINALYSIS, ROUTINE W REFLEX MICROSCOPIC    EKG: None  Radiology: CT ANGIO HEAD NECK W WO CM Result Date: 06/24/2024 CLINICAL DATA:  L sided weakness and numbness EXAM: CT ANGIOGRAPHY HEAD AND NECK WITH AND WITHOUT CONTRAST TECHNIQUE: Multidetector CT imaging of the head and neck was performed using the standard protocol during bolus administration of intravenous contrast. Multiplanar CT image reconstructions and MIPs were obtained to evaluate the vascular anatomy. Carotid stenosis measurements (when applicable) are obtained utilizing NASCET criteria, using the distal internal carotid diameter as the denominator. RADIATION DOSE REDUCTION: This exam was performed according to the departmental dose-optimization program which includes automated exposure control, adjustment of the mA and/or kV according to patient size and/or use of iterative  reconstruction technique. CONTRAST:  75mL OMNIPAQUE  IOHEXOL  350 MG/ML SOLN COMPARISON:  None Available. FINDINGS: CT HEAD FINDINGS Brain: No evidence of acute infarction, hemorrhage, hydrocephalus, extra-axial collection or mass lesion/mass effect. Vascular: See below. Skull: No acute fracture. Sinuses/Orbits: Clear sinuses.  No acute orbital findings. Other: No mastoid effusions. Review of the MIP images confirms the above findings CTA NECK FINDINGS Aortic arch: Great vessel origins are patent. No significant stenosis. Right carotid system: No evidence of dissection, stenosis (50% or greater), or occlusion. Left carotid system: No evidence of dissection, stenosis (50% or greater), or occlusion. Vertebral arteries: Codominant. No evidence of  dissection, stenosis (50% or greater), or occlusion. Skeleton: Negative. Other neck: No acute abnormality. Upper chest: Lung apices are clear. Review of the MIP images confirms the above findings CTA HEAD FINDINGS Anterior circulation: Bilateral intracranial ICAs, MCAs and ACAs are patent without proximal hemodynamically significant stenosis. Posterior circulation: Bilateral intradural vertebral arteries, basilar artery and bilateral posterior cerebral arteries are patent without proximal hemodynamically significant stenosis. Venous sinuses: As permitted by contrast timing, patent. Review of the MIP images confirms the above findings IMPRESSION: 1. No evidence of acute intracranial abnormality. 2. No large vessel occlusion or proximal hemodynamically significant stenosis. Electronically Signed   By: Gilmore GORMAN Molt M.D.   On: 06/24/2024 20:29   DG Chest Portable 1 View Result Date: 06/24/2024 CLINICAL DATA:  Left-sided weakness.  Numbness in the left arm. EXAM: PORTABLE CHEST 1 VIEW COMPARISON:  Chest CT 06/21/2024 FINDINGS: The lungs appear clear. Cardiac and mediastinal contours normal. No blunting of the costophrenic angles. IMPRESSION: 1. No active cardiopulmonary  disease is radiographically apparent. Electronically Signed   By: Ryan Salvage M.D.   On: 06/24/2024 18:34     Procedures   Medications Ordered in the ED  sodium chloride  0.9 % bolus 500 mL (has no administration in time range)  gadobutrol  (GADAVIST ) 1 MMOL/ML injection 8 mL (has no administration in time range)  iohexol  (OMNIPAQUE ) 350 MG/ML injection 75 mL (75 mLs Intravenous Contrast Given 06/24/24 1940)    Clinical Course as of 06/24/24 2120  Sun Jun 24, 2024  2114 Signed out to Dr Dean [RP]    Clinical Course User Index [RP] Yolande Lamar BROCKS, MD                                 Medical Decision Making Amount and/or Complexity of Data Reviewed Labs: ordered. Radiology: ordered.  Risk Prescription drug management.   43 year old male with a history of MS and possible stroke who presents emergency department with left-sided weakness that has been worsening over 2 to 3 weeks  Initial Ddx:  MS flare, stroke, ICH, recrudescence  MDM/Course:  Patient presents emergency department with left-sided weakness and numbness that have been going on for the past 2 to 3 weeks.  Started with left lower extremity weakness.  Then started having left hemibody numbness.  His exam is currently consistent with this as well as some ophthalmoplegia which appears to be chronic for him.  He underwent a CTA of the head and neck that did not show acute abnormalities and no subacute stroke was noted.  He is awaiting an MRI with and without contrast to evaluate for possible MS flare.  Chest x-ray and urinalysis ordered to evaluate for any infection that would cause recrudescence in case this was from a stroke.  Signed out to the oncoming physician awaiting MRI results.  If MRI is negative, patient will need ambulatory trial and if fails will need to be TOC border.  If he does well with this could potentially follow-up with neurology as an outpatient depending on what the MRI shows.  This patient  presents to the ED for concern of complaints listed in HPI, this involves an extensive number of treatment options, and is a complaint that carries with it a high risk of complications and morbidity. Disposition including potential need for admission considered.   Dispo: Pending remainder of workup  Records reviewed Outpatient Clinic Notes The following labs were independently interpreted: Chemistry and show CKD I  independently reviewed the following imaging with scope of interpretation limited to determining acute life threatening conditions related to emergency care: CT Head and agree with the radiologist interpretation with the following exceptions: none I personally reviewed and interpreted cardiac monitoring: normal sinus rhythm  I personally reviewed and interpreted the pt's EKG: see above for interpretation  I have reviewed the patients home medications and made adjustments as needed  Portions of this note were generated with Dragon dictation software. Dictation errors may occur despite best attempts at proofreading.     Final diagnoses:  Left leg weakness  Left sided numbness    ED Discharge Orders     None          Yolande Lamar BROCKS, MD 06/24/24 2120

## 2024-06-24 NOTE — ED Triage Notes (Signed)
 Pt bib ems from home; c/o numbness to L  arm and L leg x 3 weeks, worsening today around noon; recently discharged from hospital, admitted with similar symptoms; similar; hx MS; states numbness increases with MS flare; no other medical hx; stroke screen negative with ems;; denies drug or etoh use; denies pain/N/V/ HA; endorses lesion on L side of brian; pt noted to have slow speech, states worse today; 132/86, P 88, RR 18, cbg 125, 99% RA

## 2024-06-24 NOTE — ED Notes (Addendum)
 Pt slow w/ gait but Is able to ambulate w/ his home walker.

## 2024-06-24 NOTE — ED Notes (Signed)
 Pt transported to MRI

## 2024-06-24 NOTE — ED Provider Notes (Signed)
 Pt signed out by Dr. Jakie pending MRI and UA.  UA with + ketones.  MRI images reviewed by me.  I agree with the radiologist.  MRI brain: Similar age advanced T2/FLAIR hyperintensities in the  predominately periventricular white matter and pons, compatible with  known multiple sclerosis. No new or enhancing lesions.  2. No evidence of acute intracranial abnormality.   MRI cervical spine: . Similar subtle short-segment T2 hyperintensity in the cord,  compatible with known multiple sclerosis. No convincing new cord  signal abnormality. No abnormal enhancement.  2. At C4-C5, similar moderate right and severe left foraminal  stenosis.   Pt reviewed with Dr. Michaela (neurology) who does not recommend IV steroids.  Pt is able to walk with his walker.  He is stable for d/c.  Return if worse.  F/u with neurology.   Dean Clarity, MD 06/24/24 2337

## 2024-06-25 NOTE — ED Notes (Signed)
[facility] arrived to transport pt home 

## 2024-06-26 ENCOUNTER — Other Ambulatory Visit: Payer: Self-pay

## 2024-06-26 ENCOUNTER — Emergency Department (HOSPITAL_COMMUNITY)
Admission: EM | Admit: 2024-06-26 | Discharge: 2024-06-27 | Discharge status: Against Medical Advice | Attending: Emergency Medicine | Admitting: Emergency Medicine

## 2024-06-26 ENCOUNTER — Emergency Department (HOSPITAL_COMMUNITY)

## 2024-06-26 DIAGNOSIS — W19XXXA Unspecified fall, initial encounter: Secondary | ICD-10-CM | POA: Diagnosis not present

## 2024-06-26 DIAGNOSIS — M545 Low back pain, unspecified: Secondary | ICD-10-CM | POA: Diagnosis not present

## 2024-06-26 DIAGNOSIS — M546 Pain in thoracic spine: Secondary | ICD-10-CM | POA: Diagnosis present

## 2024-06-26 DIAGNOSIS — Z5321 Procedure and treatment not carried out due to patient leaving prior to being seen by health care provider: Secondary | ICD-10-CM | POA: Diagnosis not present

## 2024-06-26 DIAGNOSIS — M79631 Pain in right forearm: Secondary | ICD-10-CM | POA: Insufficient documentation

## 2024-06-26 DIAGNOSIS — H538 Other visual disturbances: Secondary | ICD-10-CM | POA: Insufficient documentation

## 2024-06-26 LAB — COMPREHENSIVE METABOLIC PANEL WITH GFR
ALT: 14 U/L (ref 0–44)
AST: 16 U/L (ref 15–41)
Albumin: 3.8 g/dL (ref 3.5–5.0)
Alkaline Phosphatase: 71 U/L (ref 38–126)
Anion gap: 11 (ref 5–15)
BUN: 6 mg/dL (ref 6–20)
CO2: 26 mmol/L (ref 22–32)
Calcium: 9.1 mg/dL (ref 8.9–10.3)
Chloride: 102 mmol/L (ref 98–111)
Creatinine, Ser: 1.34 mg/dL — ABNORMAL HIGH (ref 0.61–1.24)
GFR, Estimated: >60 mL/min (ref >60)
Glucose, Bld: 95 mg/dL (ref 70–99)
Potassium: 3.5 mmol/L (ref 3.5–5.1)
Sodium: 139 mmol/L (ref 135–145)
Total Bilirubin: 1.2 mg/dL (ref 0.0–1.2)
Total Protein: 6.6 g/dL (ref 6.5–8.1)

## 2024-06-26 LAB — CBC WITH DIFFERENTIAL/PLATELET
Abs Immature Granulocytes: 0.03 K/uL (ref 0.00–0.07)
Basophils Absolute: 0 K/uL (ref 0.0–0.1)
Basophils Relative: 1 %
Eosinophils Absolute: 0.1 K/uL (ref 0.0–0.5)
Eosinophils Relative: 1 %
HCT: 44.1 % (ref 39.0–52.0)
Hemoglobin: 14.9 g/dL (ref 13.0–17.0)
Immature Granulocytes: 0 %
Lymphocytes Relative: 16 %
Lymphs Abs: 1.2 K/uL (ref 0.7–4.0)
MCH: 34.3 pg — ABNORMAL HIGH (ref 26.0–34.0)
MCHC: 33.8 g/dL (ref 30.0–36.0)
MCV: 101.4 fL — ABNORMAL HIGH (ref 80.0–100.0)
Monocytes Absolute: 0.7 K/uL (ref 0.1–1.0)
Monocytes Relative: 8 %
Neutro Abs: 6 K/uL (ref 1.7–7.7)
Neutrophils Relative %: 74 %
Platelets: 255 K/uL (ref 150–400)
RBC: 4.35 MIL/uL (ref 4.22–5.81)
RDW: 13.6 % (ref 11.5–15.5)
WBC: 8 K/uL (ref 4.0–10.5)
nRBC: 0 % (ref 0.0–0.2)

## 2024-06-26 MED ORDER — HYDROCODONE-ACETAMINOPHEN 5-325 MG PO TABS
1.0000 | ORAL_TABLET | Freq: Once | ORAL | Status: AC
  Administered 2024-06-26: 1 via ORAL
  Filled 2024-06-26: qty 1

## 2024-06-26 NOTE — ED Provider Triage Note (Signed)
 Emergency Medicine Provider Triage Evaluation Note  Martin Barrett , a 43 y.o. male  was evaluated in triage.  Pt complains of back pain after a fall.  States he was brushing his teeth and felt dizzy and fell.  Did not hit his head.  Has mid and lower back pain as well as right forearm pain.  He has blurry vision for weeks and his left leg has been weak ever since his MS diagnosis.  No new weakness.  Review of Systems  Positive: Weakness, dizziness, back pain Negative: Headache  Physical Exam  BP 118/82   Pulse 75   Temp 98.1 F (36.7 C) (Oral)   Resp 16   SpO2 100%  Gen:   Awake, no distress   Resp:  Normal effort MSK:   Moves extremities without difficulty, save for the left leg is chronically weak.  Has thoracic, lumbar, and forearm tenderness without swelling or deformity.   Medical Decision Making  Medically screening exam initiated at 11:03 AM.  Appropriate orders placed.  Martin Barrett was informed that the remainder of the evaluation will be completed by another provider, this initial triage assessment does not replace that evaluation, and the importance of remaining in the ED until their evaluation is complete.  Will order pain meds, labs, ECG and x-rays   Martin Hamilton, MD 06/26/24 1104

## 2024-06-26 NOTE — ED Triage Notes (Signed)
 Patient with hx of MS here after falling. Slid off the bed and having back pain from how he slid down the side. Also complains of ongoing blurred vision in L eye from headaches which he say typically cause same symptoms.

## 2024-06-26 NOTE — ED Notes (Signed)
 Called name again, no answer. Taking OTF

## 2024-06-26 NOTE — ED Notes (Signed)
 Called name, no answer. I will try again.

## 2024-06-26 NOTE — Telephone Encounter (Signed)
 [facility] sent fax asking for PA on CMM be submitted on our end. Everything completed, but MD office must submit. This was submitted and PA approved. Orders on Dr. Chancy desk for signature. Then will fax everything back to [facility].

## 2024-06-27 NOTE — Congregational Nurse Program (Signed)
  Dept: [phone]   Congregational Nurse Program Note  Date of Encounter: 06/27/2024  Telephone call after receiving message from his PCP office.  ER visit on 7/27 and 7/29 do not show any acute lesions from the MS and no evidence of a CVA.  He expressed feeling very distressed from the worsening symptoms from the MS.  Made appointment with the neurologist for 7/31 and notified him and the [facility] case Production designer, theatre/television/film.  Past Medical History: Past Medical History:  Diagnosis Date   Asthma    Back pain    Coordination impairment    Falls frequently    Impaired gait and mobility    Multiple sclerosis (HCC)    Slow rate of speech    Stroke [facility])    2004   Vision blurred     Encounter Details:  Community Questionnaire - 06/27/24 1140       Questionnaire   Ask client: Do you give verbal consent for me to treat you today? Yes    Student Assistance N/A    [place]    Encounter Setting Phone/Text/Email    Population Status Unknown   Has own apartment at [facility]    Insurance/Financial Assistance Referral N/A    Medication Patient Medications Reviewed;Provided Medication Assistance    Medical Provider Yes    Screening Referrals Made N/A    Medical Referrals Made [facility] P[facility]/Clinic;[facility] PCP/Clinic    Medical Appointment Completed N/A    CNP Interventions Advocate/Support;Counsel;Case Management;[facility]    Screenings CN Performed N/A    ED Visit Averted N/A    Life-Saving Intervention Made N/A      Questionnaire   Housing/Utilities N/A

## 2024-06-27 NOTE — Telephone Encounter (Signed)
 Approval and order has been faxed back to [facility]. Confirmation received.

## 2024-06-28 ENCOUNTER — Other Ambulatory Visit (HOSPITAL_COMMUNITY): Payer: Self-pay

## 2024-06-28 ENCOUNTER — Other Ambulatory Visit: Payer: Self-pay

## 2024-06-28 ENCOUNTER — Encounter: Payer: Self-pay | Admitting: Adult Health

## 2024-06-28 ENCOUNTER — Ambulatory Visit (INDEPENDENT_AMBULATORY_CARE_PROVIDER_SITE_OTHER): Admitting: Adult Health

## 2024-06-28 VITALS — BP 138/82 | HR 78 | Ht 69.0 in | Wt 186.0 lb

## 2024-06-28 DIAGNOSIS — G35 Multiple sclerosis: Secondary | ICD-10-CM | POA: Diagnosis not present

## 2024-06-28 DIAGNOSIS — H538 Other visual disturbances: Secondary | ICD-10-CM | POA: Diagnosis not present

## 2024-06-28 DIAGNOSIS — Z79899 Other long term (current) drug therapy: Secondary | ICD-10-CM | POA: Diagnosis not present

## 2024-06-28 DIAGNOSIS — F331 Major depressive disorder, recurrent, moderate: Secondary | ICD-10-CM

## 2024-06-28 DIAGNOSIS — G8114 Spastic hemiplegia affecting left nondominant side: Secondary | ICD-10-CM

## 2024-06-28 MED ORDER — CYCLOBENZAPRINE HCL 10 MG PO TABS
10.0000 mg | ORAL_TABLET | Freq: Three times a day (TID) | ORAL | 3 refills | Status: AC | PRN
  Filled 2024-06-28 – 2024-07-23 (×4): qty 90, 30d supply, fill #0

## 2024-06-28 MED ORDER — ESCITALOPRAM OXALATE 20 MG PO TABS
20.0000 mg | ORAL_TABLET | Freq: Every day | ORAL | 11 refills | Status: AC
  Filled 2024-06-28: qty 30, 30d supply, fill #0
  Filled 2024-07-19 – 2024-07-24 (×2): qty 30, 30d supply, fill #1
  Filled 2024-08-21: qty 30, 30d supply, fill #2
  Filled 2024-09-17: qty 30, 30d supply, fill #3
  Filled 2024-10-15: qty 30, 30d supply, fill #4

## 2024-06-28 MED ORDER — ESCITALOPRAM OXALATE 20 MG PO TABS
20.0000 mg | ORAL_TABLET | Freq: Every day | ORAL | 0 refills | Status: AC
  Filled 2024-06-28: qty 90, 90d supply, fill #0
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
## 2024-06-28 NOTE — Patient Instructions (Addendum)
 Your Plan:  Increase flexeril  to 10mg  three times daily as needed for spasticity/stiffness  Increase gabapentin  night time dose to 2 capsules at night for 1-2 weeks and can further increase to 3 capsules if needed  Continue gabapentin  1 cap morning and afternoon   Increase lexapro  to 20mg  daily   Continue Ocrevous infusions, will check lab work today        Follow up with Dr. Margaret in 3-4 months or call earlier if needed      Thank you for coming to see us  at [facility]. I hope we have been able to provide you high quality care today.  You may receive a patient satisfaction survey over the next few weeks. We would appreciate your feedback and comments so that we may continue to improve ourselves and the health of our patients.

## 2024-06-29 ENCOUNTER — Ambulatory Visit: Payer: Self-pay | Admitting: Adult Health

## 2024-06-29 ENCOUNTER — Other Ambulatory Visit: Payer: Self-pay

## 2024-06-29 LAB — IGG, IGA, IGM
IgA/Immunoglobulin A, Serum: 303 mg/dL (ref 90–386)
IgG (Immunoglobin G), Serum: 962 mg/dL (ref 603–1613)
IgM (Immunoglobulin M), Srm: 56 mg/dL (ref 20–172)

## 2024-06-30 ENCOUNTER — Encounter (HOSPITAL_COMMUNITY): Payer: Self-pay

## 2024-06-30 ENCOUNTER — Emergency Department (HOSPITAL_COMMUNITY)

## 2024-06-30 ENCOUNTER — Other Ambulatory Visit: Payer: Self-pay

## 2024-06-30 ENCOUNTER — Emergency Department (HOSPITAL_COMMUNITY)
Admission: EM | Admit: 2024-06-30 | Discharge: 2024-06-30 | Disposition: A | Discharge status: Home / Self Care | Attending: Emergency Medicine | Admitting: Emergency Medicine

## 2024-06-30 DIAGNOSIS — Z79899 Other long term (current) drug therapy: Secondary | ICD-10-CM | POA: Insufficient documentation

## 2024-06-30 DIAGNOSIS — G35 Multiple sclerosis: Secondary | ICD-10-CM | POA: Diagnosis not present

## 2024-06-30 DIAGNOSIS — R531 Weakness: Secondary | ICD-10-CM

## 2024-06-30 MED ORDER — GABAPENTIN 300 MG PO CAPS
300.0000 mg | ORAL_CAPSULE | Freq: Three times a day (TID) | ORAL | Status: DC
  Administered 2024-06-30: 300 mg via ORAL
  Filled 2024-06-30: qty 1

## 2024-06-30 MED ORDER — OXYCODONE HCL 5 MG PO TABS
10.0000 mg | ORAL_TABLET | Freq: Once | ORAL | Status: AC
  Administered 2024-06-30: 10 mg via ORAL
  Filled 2024-06-30: qty 2

## 2024-06-30 NOTE — ED Provider Notes (Signed)
 [place] EMERGENCY DEPARTMENT AT [facility] Provider Note   CSN: [id number] Arrival date & time: 06/30/24  9046     Patient presents with: Martin Barrett   Martin Barrett is a 43 y.o. male.   43 y/o male with hx of relapsing remitting MS (on Ocrevus ) and possible stroke presents to the ED for ongoing L sided numbness and weakness. Reports symptoms present x 2 days; however, upon further probing, does confirm ongoing symptoms since ED visit on 06/21/24. Subjectively feels these symptoms are worsening. Had negative MRI brain and C-spine on 06/24/24. Also followed with OP Neurology on 06/28/24 who felt there was low utility in steroid use.   Fall      Prior to Admission medications   Medication Sig Start Date End Date Taking? Authorizing Provider  amLODipine  (NORVASC ) 5 MG tablet Take 1 tablet (5 mg total) by mouth at bedtime. 06/15/24     bacitracin  500 UNIT/GM ointment Apply a small amount to affected area once daily for 7 days. 02/03/24     cariprazine  (VRAYLAR ) 3 MG capsule Take 1 capsule (3 mg total) by mouth daily. 12/23/23     cyclobenzaprine  (FLEXERIL ) 10 MG tablet Take 1 tablet (10 mg total) by mouth 3 (three) times daily as needed for muscle spasms. 06/28/24   Whitfield Raisin, NP  docusate sodium  (COLACE) 100 MG capsule Take 1 capsule (100 mg total) by mouth daily. 05/08/24     escitalopram  (LEXAPRO ) 20 MG tablet Take 1 tablet (20 mg total) by mouth daily. 06/28/24   Whitfield Raisin, NP  escitalopram  (LEXAPRO ) 20 MG tablet Take 1 tablet by mouth daily 06/28/24     gabapentin  (NEURONTIN ) 300 MG capsule Take 1-2 capsules (300-600 mg total) by mouth at bedtime. 07/04/23   Penumalli, Eduard SAUNDERS, MD  gabapentin  (NEURONTIN ) 300 MG capsule Take 1 capsule (300 mg total) by mouth 3 (three) times daily. 05/24/24     hydrocortisone  ointment 0.5 % Apply topically 2 (two) times daily. 06/28/23     ibuprofen  (ADVIL ) 800 MG tablet Take 1 tablet every 8 hours as needed for pain 08/18/23     ocrelizumab   (OCREVUS ) 300 MG/10ML injection Inject 20 mLs (600 mg total) into the vein every 6 (six) months. 06/04/24   Penumalli, Eduard SAUNDERS, MD  ocrelizumab  in sodium chloride  0.9 % 500 mL Inject 300mg  into vein on day 1 & 15. Inject 600mg  into vein every 6 months thereafter. 04/19/23   Penumalli, Eduard SAUNDERS, MD  pantoprazole  (PROTONIX ) 20 MG tablet Take 1 tablet (20 mg total) by mouth daily. 06/15/24     sodium chloride  (DEEP SEA NASAL SPRAY) 0.65 % nasal spray Place 2 sprays into the nose daily. 02/03/24     traMADol  (ULTRAM ) 50 MG tablet Take 1 tablet (50 mg total) by mouth every 6 (six) to 8 (eight) hours as needed for pain. 04/18/24     DULoxetine  (CYMBALTA ) 30 MG capsule Take 1 capsule (30 mg total) by mouth daily. 07/04/23 04/27/24  Penumalli, Eduard SAUNDERS, MD    Allergies: Other and Strawberry (diagnostic)    Review of Systems Ten systems reviewed and are negative for acute change, except as noted in the HPI.    Updated Vital Signs BP (!) 134/97   Pulse 91   Temp (!) 97.5 F (36.4 C) (Oral)   Resp 16   Ht 5' 9 (1.753 m)   Wt 84.4 kg   SpO2 100%   BMI 27.47 kg/m   Physical Exam Vitals and nursing note  reviewed.  Constitutional:      General: He is not in acute distress.    Appearance: He is well-developed. He is not diaphoretic.     Comments: Nontoxic appearing and in NAD  HENT:     Head: Normocephalic and atraumatic.  Eyes:     General: No scleral icterus.    Conjunctiva/sclera: Conjunctivae normal.  Pulmonary:     Effort: Pulmonary effort is normal. No respiratory distress.     Comments: Respirations even and unlabored Musculoskeletal:        General: Normal range of motion.     Cervical back: Normal range of motion.  Skin:    General: Skin is warm and dry.     Coloration: Skin is not pale.     Findings: No erythema or rash.  Neurological:     Mental Status: He is alert and oriented to person, place, and time.     Comments: GCS 15. Speech is clear, goal oriented. Grip strength on left  weaker than right. 4/5 strength against resistance LUE with 2+ to 3/5 strength in the LLE. Reported decreased sensation to light touch on the left side. No other obvious deficits on bedside exam. Gait not assessed.  Psychiatric:        Behavior: Behavior normal.     (all labs ordered are listed, but only abnormal results are displayed) Labs Reviewed - No data to display  EKG: None  Radiology: DG Shoulder Right Result Date: 06/30/2024 CLINICAL DATA:  Fall with right shoulder pain. EXAM: RIGHT SHOULDER - 2+ VIEW COMPARISON:  None Available. FINDINGS: Minimal degenerate change of the [facility] joint. Subtle degenerative change of the glenohumeral joint. No acute fracture or dislocation. IMPRESSION: 1. No acute findings. 2. Mild degenerative changes. Electronically Signed   By: Toribio Agreste M.D.   On: 06/30/2024 12:03     Procedures   Medications Ordered in the ED  gabapentin  (NEURONTIN ) capsule 300 mg (300 mg Oral Given 06/30/24 1147)  oxyCODONE  (Oxy IR/ROXICODONE ) immediate release tablet 10 mg (10 mg Oral Given 06/30/24 1147)    Clinical Course as of 06/30/24 1334  Sat Jun 30, 2024  1225 Spoke, in person, with Dr. Vanessa of Neurology regarding patient case and work up. Per Neurology, there are instances where R/R multiple sclerosis may transition to secondary progressive causing more pronounced symptoms. Patient may also be experiencing a pseudo-flare. In either of these cases, steroids would provide no long term improvement or change in clinical course. Best option for patient is to continue on Ocrevus  as scheduled for maintenance.   No fever or other changes to vital signs to suggest secondary infection which could worsen symptoms more acutely. No leukocytosis when previously presenting to the ED on 7/24, 7/27, or 7/29. [KH]  1228 From chart review, recommendations from [facility] Neurology on 06/29/24 were to:  -Increase flexeril  to 10mg  three times daily as needed for  spasticity/stiffness -Increase gabapentin  night time dose to 2 capsules at night for 1-2 weeks and can further increase to 3 capsules if needed -Continue gabapentin  1 cap morning and afternoon  -Increase lexapro  to 20mg  daily  -Continue Ocrevous infusions, will check lab work today  [KH]  1250 Spoke with patient about options moving forward.  Would be open to PT/OT evaluation to see if more assistance is needed for patient at home. Dr. Khaliqdina did suggest completion of these evaluations for patient safety and support. Will proceed with PT/OT eval.  [KH]  1323 Patient has been seen by the transitions  of care team.  Daughter is now at bedside.  They no longer wish to proceed with PT/OT evaluation.  Comfortable with discharge. [KH]    Clinical Course User Index [KH] Keith Sor, PA-C                                 Medical Decision Making Amount and/or Complexity of Data Reviewed Radiology: ordered.   This patient presents to the ED for concern of L sided numbness and weakness, this involves an extensive number of treatment options, and is a complaint that carries with it a high risk of complications and morbidity.  The differential diagnosis includes CVA vs MS flare vs central cord compression vs psychosomatic symptom worsening.   Co morbidities that complicate the patient evaluation  R/R multiple sclerosis   Additional history obtained:  Additional history obtained from daughter, at bedside External records from outside source obtained and reviewed including MRI brain and C-spine from 06/24/24 which showed no evidence of acute MS flare.   Imaging Studies ordered:  I ordered imaging studies including R shoulder  I independently visualized and interpreted imaging which showed no acute pathology I agree with the radiologist interpretation   Cardiac Monitoring:  The patient was maintained on a cardiac monitor.  I personally viewed and interpreted the cardiac monitored which  showed an underlying rhythm of: NSR   Medicines ordered and prescription drug management:  I ordered medication including oxycodone  for pain  Reevaluation of the patient after these medicines showed that the patient improved I have reviewed the patients home medicines and have made adjustments as needed   Test Considered:  Repeat MRI brain w/w/o contrast   Consultations Obtained:  I requested consultation with Neurology and discussed lab and imaging findings as well as pertinent plan - they recommend: PT/OT evaluation, if desired by patient. No utility for steroids or further emergent imaging.   Problem List / ED Course:  As above   Reevaluation:  After the interventions noted above, I reevaluated the patient and found that they have :stayed the same   Social Determinants of Health:  Use of assist device for ADLs.   Dispostion:  After consideration of the diagnostic results and the patients response to treatment, I feel that the patent would benefit from continued OP f/u with neurology. Has next Ocrevus  dose scheduled for Thursday, per patient. Return precautions discussed and provided. Patient discharged in stable condition with no unaddressed concerns.       Final diagnoses:  Left-sided weakness  Multiple sclerosis [facility])    ED Discharge Orders     None          Keith Sor, PA-C 06/30/24 1344    Tegeler, Lonni PARAS, MD 06/30/24 [phone]

## 2024-06-30 NOTE — ED Triage Notes (Signed)
 Pt had a fall this morning. C/O right shoulder pain. Denies hitting head, no LOC, no blood thinners. C/O left sided weakness/tingling. Hx of MS. Axox4. C/O worsening tremors.

## 2024-06-30 NOTE — Discharge Instructions (Addendum)
 Your evaluation in the emergency department is reassuring.  Given your recent MRI results, it is not felt that your symptoms are related to an acute MS flare.  Continue Ocrevus  as scheduled.  Use a wheelchair when transition or mobilizing.

## 2024-06-30 NOTE — Progress Notes (Signed)
 Transition of Care [facility]) - Emergency Department Mini Assessment   Patient Details  Name: Martin Barrett MRN: [id number] Date of Birth: [birth year]  Transition of [facility] CM/SW Contact:    Debarah Saunas, RN Phone Number: 06/30/2024, 1:35 PM   Clinical Narrative: RNCM met with pt and daughter, Martin Barrett at bedside regarding increasing home health services and electric wheelchair. Martin Barrett states she has an appointment with DSS and PCP on Monday to have these issues resolved.  Pt had personal caregiver serives 3 hours daily and is looking to increase that to at least 6 hours.  Pt also plans to move in with daughter to help in case of falls in the future.  Pt has manual wheelchair and rollator for ambulation.   ED Mini Assessment: What brought you to the Emergency Department? : fall. shoulder pain  Barriers to Discharge: Continued Medical Work up     Means of departure: (P) Car  Interventions which prevented an admission or readmission: Home Health Consult or Services (Need for DME noted)    Patient Contact and Communications     Spoke with: (P) Martin Barrett, daughter at bedside  ,          Patient states their goals for this hospitalization and ongoing recovery are:: (P) return home with daughter      Admission diagnosis:  fall/l sided weakness Patient Active Problem List   Diagnosis Date Noted   Bed bug bite 12/14/2018   PCP:  Martin Aland, MD Pharmacy:    - [facility] [address], [address] [place] [address] Phone: [phone] Fax: [phone]  [facility] - [place], [place] - [address] [place] [address] Phone: [phone] Fax: [phone]

## 2024-06-30 NOTE — ED Provider Triage Note (Signed)
 Emergency Medicine Provider Triage Evaluation Note  Martin Barrett , a 43 y.o. male  was evaluated in triage.  Pt complains of left arm and leg numbness and weakness, pain over his entire body, fall with right shoulder pain.  Review of Systems  Positive: Myalgia, arthalgia, numbness, weakness, headache Negative: Fever  Physical Exam  BP 122/85 (BP Location: Right Arm)   Pulse 86   Temp 98 F (36.7 C) (Oral)   Resp 17   Ht 5' 9 (1.753 m)   Wt 84.4 kg   SpO2 98%   BMI 27.47 kg/m  Gen:   Awake, no distress   Resp:  Normal effort  MSK:   Limited range of motion of all extremities, no visible deformities, can raise both arms to shoulder level only Other:  Neuro exam - reports paresthesias in entire left arm and left leg, gross sensation in tact, fine motor and grip strength weak  Medical Decision Making  Medically screening exam initiated at 11:13 AM.  Appropriate orders placed.  Martin Barrett was informed that the remainder of the evaluation will be completed by another provider, this initial triage assessment does not replace that evaluation, and the importance of remaining in the ED until their evaluation is complete.  Hx of MS  Pt reporting abrupt onset left sided weakness last night but he's been seen in ED last week for this and had MRI Brain and C and T spine with no significant changes, and was also noted to have left sided numbness, weakness and blurred vision with neurology 3 days ago in the clinic.  This does not seem consistent with a stroke  Will order xrays left shoulder, oral pain meds  May benefit from discussion with neurology prior to needing repeat MRI's   Martin Donnice PARAS, MD 06/30/24 1115

## 2024-06-30 NOTE — ED Notes (Signed)
Pt verbalized understanding of discharge instructions. Pt wheeled from ed family to drive home

## 2024-07-02 ENCOUNTER — Other Ambulatory Visit (HOSPITAL_COMMUNITY): Payer: Self-pay

## 2024-07-02 MED ORDER — TRAMADOL HCL 50 MG PO TABS
50.0000 mg | ORAL_TABLET | Freq: Four times a day (QID) | ORAL | 0 refills | Status: AC | PRN
  Filled 2024-07-02: qty 20, 5d supply, fill #0
  Filled 2024-07-09: qty 20, 5d supply, fill #1
  Filled 2024-07-16: qty 20, 5d supply, fill #2

## 2024-07-02 NOTE — Telephone Encounter (Signed)
 Attempted to call the patient to review lab results. There was no answer. Unable to LVM  Will try again. Also per [facility] they are going to have his medication to be able to get him scheduled. This was informed to me on 06/28/2024.    Yes, we will have his med to him by Tuesday or Wednesday next week and he will be infused shortly after! Our nursing dept is going to reach out to him today to get him scheduled.

## 2024-07-02 NOTE — Telephone Encounter (Signed)
 Pt's daughter has returned call to RN

## 2024-07-02 NOTE — Telephone Encounter (Signed)
 Daughter called in. She states they are working on [employer] paperwork for her to help in his medical care. I advised that [facility] should be reaching out to get him scheduled for infusion at home and advised we will be canceling the infusion here since he can get this through home care. When the pt wakes up, she will call back so he can give verbal permission for me to discuss lab results.

## 2024-07-03 ENCOUNTER — Other Ambulatory Visit (HOSPITAL_COMMUNITY): Payer: Self-pay

## 2024-07-03 MED ORDER — DOCUSATE SODIUM 100 MG PO CAPS
100.0000 mg | ORAL_CAPSULE | Freq: Every day | ORAL | 1 refills | Status: AC
  Filled 2024-07-03: qty 100, 100d supply, fill #0
  Filled 2024-08-21: qty 30, 30d supply, fill #1
  Filled 2024-09-17: qty 30, 30d supply, fill #2
  Filled 2024-10-15: qty 30, 30d supply, fill #3

## 2024-07-04 NOTE — Congregational Nurse Program (Signed)
  Dept: [phone]   Congregational Nurse Program Note  Date of Encounter: 07/04/2024  Telephone call to verify that he is at daughter's home as recommended by the NP at [facility] d/t 3 ER visits in one week after falls when trying to walk with the walker.  Went to neurology visit as scheduled, medication dosages for nerve pain and back pain were increased.  IV infusion for MS will be on 07/05/2024. Past Medical History: Past Medical History:  Diagnosis Date   Asthma    Back pain    Coordination impairment    Falls frequently    Impaired gait and mobility    Multiple sclerosis (HCC)    Slow rate of speech    Stroke [facility])    2004   Vision blurred     Encounter Details:

## 2024-07-09 ENCOUNTER — Other Ambulatory Visit (HOSPITAL_COMMUNITY): Payer: Self-pay

## 2024-07-10 NOTE — Congregational Nurse Program (Signed)
  Dept: [phone]   Congregational Nurse Program Note  Date of Encounter: 07/10/2024  Telephone call to resident answered by his daughter, resident is basically bedridden and per daughter will be living with her.  Offered assistance with resources to ensure he has medications and treatments.  Past Medical History: Past Medical History:  Diagnosis Date   Asthma    Back pain    Coordination impairment    Falls frequently    Impaired gait and mobility    Multiple sclerosis (HCC)    Slow rate of speech    Stroke [facility])    2004   Vision blurred     Encounter Details:  Community Questionnaire - 07/10/24 1350       Questionnaire   Ask client: Do you give verbal consent for me to treat you today? Yes    Student Assistance N/A    Location Patient Served  [employer]    Encounter Setting Phone/Text/Email    Population Status Unknown   Has own apartment at [facility]    Insurance/Financial Assistance Referral N/A    Medication Have Medication Insecurities    Medical Provider Yes    Screening Referrals Made N/A    Medical Referrals Made N/A    Medical Appointment Completed N/A    CNP Interventions Advocate/Support;Counsel;Case Management    Screenings CN Performed N/A    ED Visit Averted N/A    Life-Saving Intervention Made N/A      Questionnaire   Housing/Utilities N/A

## 2024-07-11 ENCOUNTER — Other Ambulatory Visit (HOSPITAL_COMMUNITY): Payer: Self-pay

## 2024-07-11 NOTE — Congregational Nurse Program (Signed)
  Dept: [phone]   Congregational Nurse Program Note  Date of Encounter: 07/11/2024  Telephone call to resident and his daughter, he is currently at his daughter's home.  She is making arrangements for him to live with her permanently, looking for a house or larger apartment.  Given information on pharmacy delivery from [facility] and his appointment for wheelchair evaluation on August 19th. Past Medical History: Past Medical History:  Diagnosis Date   Asthma    Back pain    Coordination impairment    Falls frequently    Impaired gait and mobility    Multiple sclerosis (HCC)    Slow rate of speech    Stroke [facility])    2004   Vision blurred     Encounter Details:  Community Questionnaire - 07/11/24 0904       Questionnaire   Ask client: Do you give verbal consent for me to treat you today? Yes    Student Assistance N/A    [place]    Encounter Setting Phone/Text/Email    Population Status Unknown   Has own apartment at [facility]    Insurance/Financial Assistance Referral N/A    Medication Have Medication Insecurities;Patient Medications Reviewed    Medical Provider Yes    Screening Referrals Made N/A    Medical Referrals Made N/A    Medical Appointment Completed N/A    CNP Interventions Advocate/Support;Counsel;Case Management    Screenings CN Performed N/A    ED Visit Averted N/A    Life-Saving Intervention Made N/A      Questionnaire   Housing/Utilities N/A

## 2024-07-13 ENCOUNTER — Other Ambulatory Visit: Payer: Self-pay

## 2024-07-16 ENCOUNTER — Other Ambulatory Visit: Payer: Self-pay

## 2024-07-16 ENCOUNTER — Other Ambulatory Visit (HOSPITAL_COMMUNITY): Payer: Self-pay

## 2024-07-17 ENCOUNTER — Ambulatory Visit: Attending: Diagnostic Neuroimaging | Admitting: Physical Therapy

## 2024-07-17 DIAGNOSIS — M6281 Muscle weakness (generalized): Secondary | ICD-10-CM | POA: Insufficient documentation

## 2024-07-17 DIAGNOSIS — R2689 Other abnormalities of gait and mobility: Secondary | ICD-10-CM | POA: Diagnosis present

## 2024-07-17 DIAGNOSIS — R2681 Unsteadiness on feet: Secondary | ICD-10-CM | POA: Insufficient documentation

## 2024-07-17 NOTE — Therapy (Unsigned)
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 for optimal placement [] provides for consistent access  [] Attendant controlled joystick plus mount [] safety [] long distance driving [] operation of seat functions [] compliance with transportation regulations []                                             Rear wheel placement/Axle adjustability [] None [] semi adjustable [] fully adjustable  [] improved UE access to wheels [] improved stability [] changing angle in space for improvement of postural stability [] 1-arm drive access [] amputee pad placement []                                Wheel rims/ hand rims  [] metal   [] plastic coated [] oblique projections           [] vertical projections [] Provide ability to propel manual wheelchair  []  Increase self-propulsion with hand weakness/decreased grasp  Push handles [] extended   [] angle adjustable              [] standard [] caregiver access [] caregiver assist [] allows "hooking" to enable increased  ability to perform ADLs or maintain balance  One armed device   [] Lt   [] Rt [] enable propulsion of manual wheelchair with one arm   []                                            Brake/wheel lock extension []  Lt   []  Rt [] increase indep in applying wheel locks   [] Side guards [] prevent clothing getting caught in wheel or becoming soiled []  prevent skin tears/abrasions  Battery:   Group  24 x 2                                         [x] to power wheelchair                                                         Other:    Seat elevator                                Pt requires seat elevator to increase safety with reaching and accesibility to items located on high shelves/countertops to increase independence & safety with ADL's and self care. Seat elevator also needed to increase ease with sit to stand transfers & transfers to unlevel surfaces.                                                                             The above equipment has a life- long use expectancy. Growth and changes in medical and/or  functional conditions would be the exceptions. This is to certify that the therapist has no financial relationship with durable medical provider or manufacturer. The therapist will not receive remuneration of any kind for the equipment recommended in this evaluation.   Patient has mobility limitation that significantly impairs safe, timely participation in one or more mobility related ADL's. (bathing, toileting, feeding, dressing, grooming, moving from room to room)  [x]  Yes []  No  Will mobility device sufficiently improve ability to participate and/or be aided in participation of MRADL's?      [x]  Yes []  No  Can limitation be compensated for with use of a cane or walker?                                    []  Yes [x]  No  Does patient or caregiver demonstrate ability/potential ability & willingness to safely use the mobility device?    [x]  Yes []  No  Does patient's home environment support use of  recommended mobility device?            [x]  Yes []  No  Does patient have sufficient upper extremity function necessary to functionally propel a manual wheelchair?     []  Yes [x]  No  Does patient have sufficient strength and trunk stability to safely operate a POV (scooter)?                                  []  Yes [x]  No  Does patient need additional features/benefits provided by a power wheelchair for MRADL's in the home?        [x]  Yes []  No  Does the patient demonstrate the ability to safely use a power wheelchair?                   [x]  Yes []  No     Physician's Name Printed:   Sebastian Jerilyn HERO, FNP                                             Physician's Signature:  Date:     This is to certify that I, the above signed therapist have the following affiliations: []  This DME provider []  Manufacturer of recommended equipment []  Patient's long term care facility [x]  None of the above  Therapist Name/Signature:  Elvie Kussmaul, PT                                         Date:  07-17-24  ASSESSMENT:  CLINICAL IMPRESSION: Patient is a 43 y.o. gentleman who was seen today for physical therapy evaluation for power wheelchair evaluation due to mobility deficits due to MS.  A group 3 power wheelchair with power tilt and recline has been recommended to enable pt to be independent and safe with mobility and ADL's in his home environment, as well as independence with performing adequate pressure relief to prevent skin breakdown.  Selinda Bach, ATP with [facility], present during evaluation.   CLINICAL DECISION MAKING: Evolving/moderate complexity  EVALUATION COMPLEXITY: Moderate   PLAN:  PT FREQUENCY: one time visit  PT DURATION: 1 week  PLANNED INTERVENTIONS: w/c eval and  assistive technology assessment with report.  PLAN FOR NEXT SESSION: N/A - wheelchair eval only   Roxanna [place], PT 07/20/2024, 1:23 PM -

## 2024-07-19 ENCOUNTER — Other Ambulatory Visit: Payer: Self-pay

## 2024-07-20 ENCOUNTER — Encounter: Payer: Self-pay | Admitting: Physical Therapy

## 2024-07-20 ENCOUNTER — Other Ambulatory Visit: Payer: Self-pay

## 2024-07-23 ENCOUNTER — Other Ambulatory Visit: Payer: Self-pay

## 2024-07-24 ENCOUNTER — Other Ambulatory Visit: Payer: Self-pay

## 2024-07-25 ENCOUNTER — Other Ambulatory Visit: Payer: Self-pay

## 2024-07-25 ENCOUNTER — Other Ambulatory Visit (HOSPITAL_COMMUNITY): Payer: Self-pay

## 2024-07-25 MED ORDER — TRAMADOL HCL 50 MG PO TABS
50.0000 mg | ORAL_TABLET | Freq: Two times a day (BID) | ORAL | 0 refills | Status: AC | PRN
  Filled 2024-07-25: qty 10, 5d supply, fill #0

## 2024-07-26 ENCOUNTER — Other Ambulatory Visit (HOSPITAL_COMMUNITY): Payer: Self-pay

## 2024-07-26 ENCOUNTER — Telehealth: Payer: Self-pay | Admitting: Neurology

## 2024-07-26 ENCOUNTER — Other Ambulatory Visit: Payer: Self-pay

## 2024-07-26 NOTE — Telephone Encounter (Signed)
 Received paperwork that needed completing for power wheelchair for the pt. Orders are signed and faxed to [facility]. Received confirmation

## 2024-07-27 ENCOUNTER — Other Ambulatory Visit: Payer: Self-pay

## 2024-08-17 ENCOUNTER — Other Ambulatory Visit: Payer: Self-pay

## 2024-08-17 ENCOUNTER — Other Ambulatory Visit (HOSPITAL_COMMUNITY): Payer: Self-pay

## 2024-08-21 ENCOUNTER — Other Ambulatory Visit: Payer: Self-pay

## 2024-08-22 ENCOUNTER — Other Ambulatory Visit: Payer: Self-pay

## 2024-08-24 ENCOUNTER — Other Ambulatory Visit: Payer: Self-pay

## 2024-08-29 ENCOUNTER — Telehealth: Payer: Self-pay | Admitting: *Deleted

## 2024-08-29 NOTE — Telephone Encounter (Signed)
 Faxed signed order back to [facility] at [phone]. Received fax confirmation.

## 2024-09-04 ENCOUNTER — Other Ambulatory Visit (HOSPITAL_COMMUNITY): Payer: Self-pay

## 2024-09-17 ENCOUNTER — Other Ambulatory Visit: Payer: Self-pay

## 2024-09-18 ENCOUNTER — Other Ambulatory Visit: Payer: Self-pay

## 2024-09-18 ENCOUNTER — Other Ambulatory Visit (HOSPITAL_COMMUNITY): Payer: Self-pay

## 2024-09-18 MED ORDER — CARIPRAZINE HCL 3 MG PO CAPS
3.0000 mg | ORAL_CAPSULE | Freq: Every day | ORAL | 1 refills | Status: AC
  Filled 2024-09-18: qty 30, 30d supply, fill #0
  Filled 2024-10-15: qty 30, 30d supply, fill #1

## 2024-09-18 MED ORDER — GABAPENTIN 300 MG PO CAPS
300.0000 mg | ORAL_CAPSULE | Freq: Three times a day (TID) | ORAL | 3 refills | Status: AC
  Filled 2024-09-18: qty 90, 30d supply, fill #0
  Filled 2024-10-15: qty 90, 30d supply, fill #1

## 2024-09-18 MED ORDER — AMLODIPINE BESYLATE 5 MG PO TABS
5.0000 mg | ORAL_TABLET | Freq: Every evening | ORAL | 0 refills | Status: AC
  Filled 2024-09-18: qty 30, 30d supply, fill #0
  Filled 2024-10-15: qty 30, 30d supply, fill #1

## 2024-09-19 ENCOUNTER — Other Ambulatory Visit: Payer: Self-pay

## 2024-09-20 ENCOUNTER — Other Ambulatory Visit: Payer: Self-pay

## 2024-09-21 ENCOUNTER — Other Ambulatory Visit: Payer: Self-pay

## 2024-09-24 ENCOUNTER — Other Ambulatory Visit: Payer: Self-pay

## 2024-10-08 ENCOUNTER — Ambulatory Visit: Admitting: Diagnostic Neuroimaging

## 2024-10-08 ENCOUNTER — Encounter: Payer: Self-pay | Admitting: Diagnostic Neuroimaging

## 2024-10-15 ENCOUNTER — Other Ambulatory Visit: Payer: Self-pay

## 2024-10-16 ENCOUNTER — Other Ambulatory Visit: Payer: Self-pay

## 2024-10-17 ENCOUNTER — Other Ambulatory Visit: Payer: Self-pay

## 2024-10-18 ENCOUNTER — Other Ambulatory Visit: Payer: Self-pay

## 2024-10-23 ENCOUNTER — Other Ambulatory Visit: Payer: Self-pay

## 2024-10-28 ENCOUNTER — Other Ambulatory Visit (HOSPITAL_COMMUNITY): Payer: Self-pay

## 2024-10-29 ENCOUNTER — Other Ambulatory Visit: Payer: Self-pay

## 2024-11-08 ENCOUNTER — Other Ambulatory Visit: Payer: Self-pay

## 2024-11-08 ENCOUNTER — Other Ambulatory Visit (HOSPITAL_COMMUNITY): Payer: Self-pay
